# Patient Record
Sex: Male | Born: 1965 | Race: White | Hispanic: No | Marital: Married | Smoking: Never smoker
Health system: Southern US, Community
[De-identification: ages and names within clinical notes are randomized; demographics above are authoritative.]

## PROBLEM LIST (undated history)

## (undated) DIAGNOSIS — G1221 Amyotrophic lateral sclerosis: Secondary | ICD-10-CM

## (undated) HISTORY — PX: GASTROSTOMY TUBE PLACEMENT: SHX655

## (undated) HISTORY — PX: TRACHEOSTOMY: SUR1362

---

## 2019-09-10 ENCOUNTER — Emergency Department (HOSPITAL_COMMUNITY): Payer: BC Managed Care – PPO

## 2019-09-10 ENCOUNTER — Encounter (HOSPITAL_COMMUNITY): Payer: Self-pay | Admitting: Emergency Medicine

## 2019-09-10 ENCOUNTER — Emergency Department (HOSPITAL_COMMUNITY)
Admission: EM | Admit: 2019-09-10 | Discharge: 2019-09-10 | Disposition: A | Discharge status: Home / Self Care | Payer: BC Managed Care – PPO | Attending: Emergency Medicine | Admitting: Emergency Medicine

## 2019-09-10 DIAGNOSIS — I959 Hypotension, unspecified: Secondary | ICD-10-CM | POA: Diagnosis not present

## 2019-09-10 DIAGNOSIS — R0602 Shortness of breath: Secondary | ICD-10-CM | POA: Diagnosis present

## 2019-09-10 HISTORY — DX: Amyotrophic lateral sclerosis: G12.21

## 2019-09-10 LAB — COMPREHENSIVE METABOLIC PANEL
ALT: 15 U/L (ref 0–44)
AST: 13 U/L — ABNORMAL LOW (ref 15–41)
Albumin: 1.8 g/dL — ABNORMAL LOW (ref 3.5–5.0)
Alkaline Phosphatase: 59 U/L (ref 38–126)
Anion gap: 7 (ref 5–15)
BUN: 31 mg/dL — ABNORMAL HIGH (ref 6–20)
CO2: 31 mmol/L (ref 22–32)
Calcium: 8.3 mg/dL — ABNORMAL LOW (ref 8.9–10.3)
Chloride: 112 mmol/L — ABNORMAL HIGH (ref 98–111)
Creatinine, Ser: <0.3 mg/dL — ABNORMAL LOW (ref 0.61–1.24)
Glucose, Bld: 112 mg/dL — ABNORMAL HIGH (ref 70–99)
Potassium: 3.8 mmol/L (ref 3.5–5.1)
Sodium: 150 mmol/L — ABNORMAL HIGH (ref 135–145)
Total Bilirubin: 0.4 mg/dL (ref 0.3–1.2)
Total Protein: 6.6 g/dL (ref 6.5–8.1)

## 2019-09-10 LAB — CBC WITH DIFFERENTIAL/PLATELET
Abs Immature Granulocytes: 0.47 10*3/uL — ABNORMAL HIGH (ref 0.00–0.07)
Basophils Absolute: 0.1 10*3/uL (ref 0.0–0.1)
Basophils Relative: 1 %
Eosinophils Absolute: 1.1 10*3/uL — ABNORMAL HIGH (ref 0.0–0.5)
Eosinophils Relative: 9 %
HCT: 29.5 % — ABNORMAL LOW (ref 39.0–52.0)
Hemoglobin: 8.4 g/dL — ABNORMAL LOW (ref 13.0–17.0)
Lymphocytes Relative: 23 %
Lymphs Abs: 2.8 10*3/uL (ref 0.7–4.0)
MCH: 27.5 pg (ref 26.0–34.0)
MCHC: 28.5 g/dL — ABNORMAL LOW (ref 30.0–36.0)
MCV: 96.7 fL (ref 80.0–100.0)
Monocytes Absolute: 0.6 10*3/uL (ref 0.1–1.0)
Monocytes Relative: 5 %
Neutro Abs: 7.3 10*3/uL (ref 1.7–7.7)
Neutrophils Relative %: 58 %
Platelets: 464 10*3/uL — ABNORMAL HIGH (ref 150–400)
RBC: 3.05 MIL/uL — ABNORMAL LOW (ref 4.22–5.81)
RDW: 15.6 % — ABNORMAL HIGH (ref 11.5–15.5)
WBC: 12.3 10*3/uL — ABNORMAL HIGH (ref 4.0–10.5)
nRBC: 0 % (ref 0.0–0.2)

## 2019-09-10 LAB — APTT: aPTT: 29 seconds (ref 24–36)

## 2019-09-10 LAB — URINALYSIS, ROUTINE W REFLEX MICROSCOPIC
Bilirubin Urine: NEGATIVE
Glucose, UA: NEGATIVE mg/dL
Ketones, ur: NEGATIVE mg/dL
Nitrite: NEGATIVE
Protein, ur: 100 mg/dL — AB
RBC / HPF: >50 RBC/hpf — ABNORMAL HIGH (ref 0–5)
Specific Gravity, Urine: 1.025 (ref 1.005–1.030)
pH: 7 (ref 5.0–8.0)

## 2019-09-10 LAB — PROTIME-INR
INR: 1.1 (ref 0.8–1.2)
Prothrombin Time: 13.8 seconds (ref 11.4–15.2)

## 2019-09-10 LAB — LACTIC ACID, PLASMA: Lactic Acid, Venous: 1 mmol/L (ref 0.5–1.9)

## 2019-09-10 MED ORDER — SODIUM CHLORIDE 0.9 % IV BOLUS
1000.0000 mL | Freq: Once | INTRAVENOUS | Status: AC
  Administered 2019-09-10: 1000 mL via INTRAVENOUS

## 2019-09-10 NOTE — ED Notes (Signed)
Irving Burton (Wife/POA#[phone]) called for an update.  Thank you

## 2019-09-10 NOTE — ED Provider Notes (Signed)
[facility] EMERGENCY DEPARTMENT Provider Note   CSN: [id number] Arrival date & time: 09/10/19  8127     History Chief Complaint  Patient presents with  . Shortness of Breath    Stuart Woods is a 54 y.o. male.  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  Sent here for hypotension and change in mental status.  Per EMS was noted just this morning.  Currently being treated for pneumonia with meropenem and vancomycin.  Patient is at his baseline per EMS.  Nonverbal.  Level 5 caveat.  The history is provided by the patient.  Shortness of Breath Severity:  Moderate Onset quality:  Gradual Duration:  2 days Timing:  Constant Progression:  Worsening Chronicity:  New Relieved by:  Nothing Worsened by:  Nothing Ineffective treatments:  None tried Associated symptoms: no abdominal pain, no chest pain, no fever, no headaches, no rash and no vomiting        History reviewed. No pertinent past medical history.  There are no problems to display for this patient.   History reviewed. No pertinent surgical history.     No family history on file.  Social History   Tobacco Use  . Smoking status: Not on file  Substance Use Topics  . Alcohol use: Not on file  . Drug use: Not on file    Home Medications Prior to Admission medications   Not on File    Allergies    Patient has no allergy information on record.  Review of Systems   Review of Systems  Unable to perform ROS: Patient nonverbal  Constitutional: Positive for activity change. Negative for chills and fever.  HENT: Negative for congestion and facial swelling.   Eyes: Negative for discharge and visual disturbance.  Respiratory: Negative for shortness of breath.   Cardiovascular: Negative for chest pain and palpitations.  Gastrointestinal: Negative for abdominal pain, diarrhea and vomiting.  Musculoskeletal: Negative for arthralgias  and myalgias.  Skin: Negative for color change and rash.  Neurological: Negative for tremors, syncope and headaches.  Psychiatric/Behavioral: Negative for confusion and dysphoric mood.    Physical Exam Updated Vital Signs BP 91/69   Pulse 80   Temp (!) 97.4 F (36.3 C) (Axillary)   Resp 20   SpO2 100%   Physical Exam Vitals and nursing note reviewed.  Constitutional:      Appearance: He is well-developed.     Comments: Chronically ill-appearing  HENT:     Head: Normocephalic and atraumatic.  Eyes:     Pupils: Pupils are equal, round, and reactive to light.  Neck:     Vascular: No JVD.  Cardiovascular:     Rate and Rhythm: Normal rate and regular rhythm.     Heart sounds: No murmur. No friction rub. No gallop.   Pulmonary:     Effort: No respiratory distress.     Breath sounds: No wheezing.  Abdominal:     General: There is no distension.     Tenderness: There is no guarding or rebound.  Musculoskeletal:        General: Normal range of motion.     Cervical back: Normal range of motion and neck supple.     Comments: Diffuse muscle wasting  Skin:    Coloration: Skin is not pale.     Findings: No rash.  Neurological:     Mental Status: He is alert and oriented to person, place,  and time.  Psychiatric:        Behavior: Behavior normal.     ED Results / Procedures / Treatments   Labs (all labs ordered are listed, but only abnormal results are displayed) Labs Reviewed  COMPREHENSIVE METABOLIC PANEL - Abnormal; Notable for the following components:      Result Value   Sodium 150 (*)    Chloride 112 (*)    Glucose, Bld 112 (*)    BUN 31 (*)    Creatinine, Ser <0.30 (*)    Calcium 8.3 (*)    Albumin 1.8 (*)    AST 13 (*)    All other components within normal limits  CBC WITH DIFFERENTIAL/PLATELET - Abnormal; Notable for the following components:   WBC 12.3 (*)    RBC 3.05 (*)    Hemoglobin 8.4 (*)    HCT 29.5 (*)    MCHC 28.5 (*)    RDW 15.6 (*)     Platelets 464 (*)    Eosinophils Absolute 1.1 (*)    Abs Immature Granulocytes 0.47 (*)    All other components within normal limits  CULTURE, BLOOD (ROUTINE X 2)  CULTURE, BLOOD (ROUTINE X 2)  URINE CULTURE  LACTIC ACID, PLASMA  APTT  PROTIME-INR  LACTIC ACID, PLASMA  URINALYSIS, ROUTINE W REFLEX MICROSCOPIC    EKG EKG Interpretation  Date/Time:  Wednesday September 10 2019 10:00:28 EST Ventricular Rate:  76 PR Interval:    QRS Duration: 88 QT Interval:  413 QTC Calculation: 465 R Axis:   68 Text Interpretation: Sinus rhythm Borderline short PR interval ST elev, probable normal early repol pattern No old tracing to compare Confirmed by Melene Plan [phone]) on 09/10/2019 10:30:51 AM   Radiology DG Chest Port 1 View  Result Date: 09/10/2019 CLINICAL DATA:  Hypotension EXAM: PORTABLE CHEST 1 VIEW COMPARISON:  Portable exam 0951 hours without priors for comparison FINDINGS: Tip of RIGHT arm PICC line projects over SVC near cavoatrial junction. Tracheostomy tube projects over tracheal air column. Normal heart size, mediastinal contours, and pulmonary vascularity. Atelectasis versus consolidation LEFT lower lobe. Remaining lungs clear. No pleural effusion or pneumothorax. Sclerosis at LEFT humeral head could be degenerative or due to avascular necrosis. IMPRESSION: LEFT lower lobe atelectasis versus consolidation. Sclerosis at LEFT humeral head question degenerative or due to avascular necrosis. Electronically Signed   By: Ulyses Southward M.D.   On: 09/10/2019 10:18    Procedures Procedures (including critical care time)  Medications Ordered in ED Medications  sodium chloride 0.9 % bolus 1,000 mL (1,000 mLs Intravenous New Bag/Given 09/10/19 1148)    ED Course  I have reviewed the triage vital signs and the nursing notes.  Pertinent labs & imaging results that were available during my care of the patient were reviewed by me and considered in my medical decision making (see chart for  details).    MDM Rules/Calculators/A&P                      54 yo M with a chief complaints of change in mental status and hypotension.  Unfortunately the patient has ALS and is chronically vent dependent.  He is currently getting a course of antibiotics for HCAP.  On Vanco and meropenem.  Patient reportedly back to baseline after a small bolus of IV fluids.  Will give another bolus of IV fluids.  Chest x-ray lab work lactate.  Lactic acid is normal.  Patient is hyponatremic.  Continues to have stable blood  pressures.  No urine sample as of yet though I do not feel that it would change necessarily his disposition.  Patient is currently able to get antibiotics at his facility has not had any hypotension here and his lactate is normal.  He is on broad-spectrum therapy that I would likely not deviate for without a culture.  We will have him go back to his facility.  They can check a urine if they feel that that is the source.  3:30 PM:  I have discussed the diagnosis/risks/treatment options with the patient and believe the pt to be eligible for discharge home to follow-up with PCP. We also discussed returning to the ED immediately if new or worsening sx occur. We discussed the sx which are most concerning (e.g., sudden worsening pain, fever, inability to tolerate by mouth) that necessitate immediate return. Medications administered to the patient during their visit and any new prescriptions provided to the patient are listed below.  Medications given during this visit Medications  sodium chloride 0.9 % bolus 1,000 mL (1,000 mLs Intravenous New Bag/Given 09/10/19 1148)     The patient appears reasonably screen and/or stabilized for discharge and I doubt any other medical condition or other [facility] requiring further screening, evaluation, or treatment in the ED at this time prior to discharge.   Final Clinical Impression(s) / ED Diagnoses Final diagnoses:  Hypotension, unspecified hypotension type     Rx / DC Orders ED Discharge Orders    None       Deno Etienne, DO 09/10/19 1530

## 2019-09-10 NOTE — Discharge Instructions (Addendum)
Return for worsening symptoms.    

## 2019-09-10 NOTE — ED Provider Notes (Signed)
Pending basic labs and UA, if within patient's baseline, anticipate D/C. Physical Exam  BP 91/69   Pulse 80   Temp (!) 97.4 F (36.3 C) (Axillary)   Resp 20   SpO2 100%   Physical Exam  ED Course/Procedures     Procedures  MDM  Dr. Adela Lank determined appropriate for d/c. Will continue to observe while in ED awaiting transport.       Arby Barrette, MD 09/10/19 [phone]

## 2019-09-10 NOTE — ED Triage Notes (Addendum)
Pt here from [facility] 82/60 bp, decreased loc hr 79, O2 98 (pt is vented trach).  Given 500 ns and bp increased to 98 sytolic and loc increased after fluids.  Per [facility], pt is back to his baseline.   Pt has been at [facility] since October for ALS.

## 2019-09-11 LAB — URINE CULTURE: Culture: NO GROWTH

## 2019-09-15 LAB — CULTURE, BLOOD (ROUTINE X 2)
Culture: NO GROWTH
Culture: NO GROWTH
Special Requests: ADEQUATE

## 2019-09-21 ENCOUNTER — Encounter (HOSPITAL_COMMUNITY): Payer: Self-pay | Admitting: Emergency Medicine

## 2019-09-21 ENCOUNTER — Emergency Department (HOSPITAL_COMMUNITY): Payer: BC Managed Care – PPO

## 2019-09-21 ENCOUNTER — Inpatient Hospital Stay (HOSPITAL_COMMUNITY)
Admission: EM | Admit: 2019-09-21 | Discharge: 2019-09-23 | DRG: 640 | Disposition: A | Discharge status: Other Acute Inpatient Hospital | Payer: BC Managed Care – PPO | Source: Other Acute Inpatient Hospital | Attending: Pulmonary Disease | Admitting: Pulmonary Disease

## 2019-09-21 ENCOUNTER — Other Ambulatory Visit: Payer: Self-pay

## 2019-09-21 DIAGNOSIS — L89152 Pressure ulcer of sacral region, stage 2: Secondary | ICD-10-CM | POA: Diagnosis present

## 2019-09-21 DIAGNOSIS — G1221 Amyotrophic lateral sclerosis: Secondary | ICD-10-CM | POA: Diagnosis not present

## 2019-09-21 DIAGNOSIS — D649 Anemia, unspecified: Secondary | ICD-10-CM | POA: Diagnosis present

## 2019-09-21 DIAGNOSIS — E874 Mixed disorder of acid-base balance: Secondary | ICD-10-CM | POA: Diagnosis not present

## 2019-09-21 DIAGNOSIS — M1990 Unspecified osteoarthritis, unspecified site: Secondary | ICD-10-CM | POA: Diagnosis not present

## 2019-09-21 DIAGNOSIS — L89322 Pressure ulcer of left buttock, stage 2: Secondary | ICD-10-CM | POA: Diagnosis present

## 2019-09-21 DIAGNOSIS — K219 Gastro-esophageal reflux disease without esophagitis: Secondary | ICD-10-CM | POA: Diagnosis not present

## 2019-09-21 DIAGNOSIS — Z820 Family history of epilepsy and other diseases of the nervous system: Secondary | ICD-10-CM

## 2019-09-21 DIAGNOSIS — E876 Hypokalemia: Secondary | ICD-10-CM | POA: Diagnosis present

## 2019-09-21 DIAGNOSIS — F329 Major depressive disorder, single episode, unspecified: Secondary | ICD-10-CM | POA: Diagnosis present

## 2019-09-21 DIAGNOSIS — N390 Urinary tract infection, site not specified: Secondary | ICD-10-CM

## 2019-09-21 DIAGNOSIS — Z9911 Dependence on respirator [ventilator] status: Secondary | ICD-10-CM | POA: Diagnosis not present

## 2019-09-21 DIAGNOSIS — I959 Hypotension, unspecified: Secondary | ICD-10-CM | POA: Diagnosis not present

## 2019-09-21 DIAGNOSIS — Z20822 Contact with and (suspected) exposure to covid-19: Secondary | ICD-10-CM | POA: Diagnosis present

## 2019-09-21 DIAGNOSIS — G9340 Encephalopathy, unspecified: Secondary | ICD-10-CM | POA: Diagnosis not present

## 2019-09-21 DIAGNOSIS — J9610 Chronic respiratory failure, unspecified whether with hypoxia or hypercapnia: Secondary | ICD-10-CM | POA: Diagnosis present

## 2019-09-21 DIAGNOSIS — R4182 Altered mental status, unspecified: Secondary | ICD-10-CM

## 2019-09-21 DIAGNOSIS — Z930 Tracheostomy status: Secondary | ICD-10-CM

## 2019-09-21 DIAGNOSIS — L89891 Pressure ulcer of other site, stage 1: Secondary | ICD-10-CM | POA: Diagnosis not present

## 2019-09-21 DIAGNOSIS — L89312 Pressure ulcer of right buttock, stage 2: Secondary | ICD-10-CM | POA: Diagnosis present

## 2019-09-21 DIAGNOSIS — L89111 Pressure ulcer of right upper back, stage 1: Secondary | ICD-10-CM | POA: Diagnosis present

## 2019-09-21 DIAGNOSIS — G9341 Metabolic encephalopathy: Secondary | ICD-10-CM | POA: Diagnosis not present

## 2019-09-21 DIAGNOSIS — J189 Pneumonia, unspecified organism: Secondary | ICD-10-CM

## 2019-09-21 DIAGNOSIS — Z931 Gastrostomy status: Secondary | ICD-10-CM | POA: Diagnosis not present

## 2019-09-21 DIAGNOSIS — Z79899 Other long term (current) drug therapy: Secondary | ICD-10-CM

## 2019-09-21 DIAGNOSIS — Z8701 Personal history of pneumonia (recurrent): Secondary | ICD-10-CM | POA: Diagnosis not present

## 2019-09-21 DIAGNOSIS — L8990 Pressure ulcer of unspecified site, unspecified stage: Secondary | ICD-10-CM | POA: Insufficient documentation

## 2019-09-21 DIAGNOSIS — J9601 Acute respiratory failure with hypoxia: Secondary | ICD-10-CM

## 2019-09-21 LAB — CBC WITH DIFFERENTIAL/PLATELET
Abs Immature Granulocytes: 0.08 10*3/uL — ABNORMAL HIGH (ref 0.00–0.07)
Basophils Absolute: 0 10*3/uL (ref 0.0–0.1)
Basophils Relative: 0 %
Eosinophils Absolute: 0.1 10*3/uL (ref 0.0–0.5)
Eosinophils Relative: 1 %
HCT: 31.8 % — ABNORMAL LOW (ref 39.0–52.0)
Hemoglobin: 9.3 g/dL — ABNORMAL LOW (ref 13.0–17.0)
Immature Granulocytes: 1 %
Lymphocytes Relative: 12 %
Lymphs Abs: 1.5 10*3/uL (ref 0.7–4.0)
MCH: 27.6 pg (ref 26.0–34.0)
MCHC: 29.2 g/dL — ABNORMAL LOW (ref 30.0–36.0)
MCV: 94.4 fL (ref 80.0–100.0)
Monocytes Absolute: 0.4 10*3/uL (ref 0.1–1.0)
Monocytes Relative: 3 %
Neutro Abs: 10.5 10*3/uL — ABNORMAL HIGH (ref 1.7–7.7)
Neutrophils Relative %: 83 %
Platelets: 735 10*3/uL — ABNORMAL HIGH (ref 150–400)
RBC: 3.37 MIL/uL — ABNORMAL LOW (ref 4.22–5.81)
RDW: 16.1 % — ABNORMAL HIGH (ref 11.5–15.5)
WBC: 12.7 10*3/uL — ABNORMAL HIGH (ref 4.0–10.5)
nRBC: 0 % (ref 0.0–0.2)

## 2019-09-21 LAB — CBG MONITORING, ED: Glucose-Capillary: 97 mg/dL (ref 70–99)

## 2019-09-21 NOTE — ED Triage Notes (Signed)
Pt from [facility] by [facility], hx of ALS, trach, and vent dept. Staff called for transport for pt having increased AMS.  G tube present. PICC line to R arm and butterfly IV to R hand

## 2019-09-21 NOTE — ED Provider Notes (Signed)
[facility] EMERGENCY DEPARTMENT Provider Note   CSN: [id number] Arrival date & time: 09/21/19  2217     History Chief Complaint  Patient presents with  . Altered Mental Status    Stuart Woods is a 54 y.o. male.  The history is provided by the EMS personnel and medical records. The history is limited by the condition of the patient.  Altered Mental Status Presenting symptoms: partial responsiveness   Severity:  Moderate Most recent episode:  Yesterday Episode history:  Single Duration:  1 day Timing:  Constant Progression:  Unchanged Context: not dementia   Associated symptoms: no abdominal pain, no fever and no weakness        Past Medical History:  Diagnosis Date  . ALS (amyotrophic lateral sclerosis) ([place])     There are no problems to display for this patient.   Past Surgical History:  Procedure Laterality Date  . GASTROSTOMY TUBE PLACEMENT    . TRACHEOSTOMY         History reviewed. No pertinent family history.  Social History   Tobacco Use  . Smoking status: Not on file  Substance Use Topics  . Alcohol use: Not on file  . Drug use: Not on file    Home Medications Prior to Admission medications   Not on File    Allergies    Patient has no known allergies.  Review of Systems   Review of Systems  Unable to perform ROS: Mental status change  Constitutional: Negative for fever.  Gastrointestinal: Negative for abdominal pain.  Neurological: Negative for weakness.    Physical Exam Updated Vital Signs BP 92/71   Pulse 95   Temp 97.9 F (36.6 C) (Temporal)   Resp 20   SpO2 100%   Physical Exam Vitals and nursing note reviewed.  Constitutional:      General: He is not in acute distress.    Appearance: Normal appearance.  HENT:     Head: Normocephalic and atraumatic.     Nose: Nose normal.  Eyes:     Conjunctiva/sclera: Conjunctivae normal.     Pupils: Pupils are equal, round, and reactive to light.   Cardiovascular:     Rate and Rhythm: Normal rate and regular rhythm.     Pulses: Normal pulses.     Heart sounds: Normal heart sounds.  Pulmonary:     Breath sounds: No rales.  Abdominal:     General: Abdomen is flat. Bowel sounds are normal.     Tenderness: There is no abdominal tenderness. There is no guarding.  Musculoskeletal:     Cervical back: Normal range of motion and neck supple.     Right lower leg: No edema.     Left lower leg: No edema.  Skin:    General: Skin is warm and dry.     Capillary Refill: Capillary refill takes less than 2 seconds.  Neurological:     Deep Tendon Reflexes: Reflexes normal.  Psychiatric:     Comments: unable     ED Results / Procedures / Treatments   Labs (all labs ordered are listed, but only abnormal results are displayed) Results for orders placed or performed during the hospital encounter of 09/21/19  CBC with Differential  Result Value Ref Range   WBC 12.7 (H) 4.0 - 10.5 K/uL   RBC 3.37 (L) 4.22 - 5.81 MIL/uL   Hemoglobin 9.3 (L) 13.0 - 17.0 g/dL   HCT 31.8 (L) 39.0 - 52.0 %   MCV 94.4 80.0 -  100.0 fL   MCH 27.6 26.0 - 34.0 pg   MCHC 29.2 (L) 30.0 - 36.0 g/dL   RDW 50.9 (H) 32.6 - 71.2 %   Platelets 735 (H) 150 - 400 K/uL   nRBC 0.0 0.0 - 0.2 %   Neutrophils Relative % 83 %   Neutro Abs 10.5 (H) 1.7 - 7.7 K/uL   Lymphocytes Relative 12 %   Lymphs Abs 1.5 0.7 - 4.0 K/uL   Monocytes Relative 3 %   Monocytes Absolute 0.4 0.1 - 1.0 K/uL   Eosinophils Relative 1 %   Eosinophils Absolute 0.1 0.0 - 0.5 K/uL   Basophils Relative 0 %   Basophils Absolute 0.0 0.0 - 0.1 K/uL   Immature Granulocytes 1 %   Abs Immature Granulocytes 0.08 (H) 0.00 - 0.07 K/uL  Comprehensive metabolic panel  Result Value Ref Range   Sodium 138 135 - 145 mmol/L   Potassium 4.3 3.5 - 5.1 mmol/L   Chloride 95 (L) 98 - 111 mmol/L   CO2 33 (H) 22 - 32 mmol/L   Glucose, Bld 102 (H) 70 - 99 mg/dL   BUN 19 6 - 20 mg/dL   Creatinine, Ser <4.58 (L) 0.61 -  1.24 mg/dL   Calcium 9.4 8.9 - 09.9 mg/dL   Total Protein 7.6 6.5 - 8.1 g/dL   Albumin 2.4 (L) 3.5 - 5.0 g/dL   AST 18 15 - 41 U/L   ALT 25 0 - 44 U/L   Alkaline Phosphatase 81 38 - 126 U/L   Total Bilirubin 0.3 0.3 - 1.2 mg/dL   GFR calc non Af Amer NOT CALCULATED >60 mL/min   GFR calc Af Amer NOT CALCULATED >60 mL/min   Anion gap 10 5 - 15  Urinalysis, Routine w reflex microscopic  Result Value Ref Range   Color, Urine YELLOW YELLOW   APPearance TURBID (A) CLEAR   Specific Gravity, Urine 1.028 1.005 - 1.030   pH 5.0 5.0 - 8.0   Glucose, UA NEGATIVE NEGATIVE mg/dL   Hgb urine dipstick NEGATIVE NEGATIVE   Bilirubin Urine NEGATIVE NEGATIVE   Ketones, ur NEGATIVE NEGATIVE mg/dL   Protein, ur 833 (A) NEGATIVE mg/dL   Nitrite NEGATIVE NEGATIVE   Leukocytes,Ua NEGATIVE NEGATIVE   RBC / HPF 6-10 0 - 5 RBC/hpf   WBC, UA 6-10 0 - 5 WBC/hpf   Bacteria, UA MANY (A) NONE SEEN   Squamous Epithelial / LPF 0-5 0 - 5   Mucus PRESENT    Hyaline Casts, UA PRESENT    Uric Acid Crys, UA PRESENT   Ammonia  Result Value Ref Range   Ammonia 44 (H) 9 - 35 umol/L  Acetaminophen level  Result Value Ref Range   Acetaminophen (Tylenol), Serum <10 (L) 10 - 30 ug/mL  Salicylate level  Result Value Ref Range   Salicylate Lvl <7.0 (L) 7.0 - 30.0 mg/dL  Rapid urine drug screen (hospital performed)  Result Value Ref Range   Opiates NONE DETECTED NONE DETECTED   Cocaine NONE DETECTED NONE DETECTED   Benzodiazepines NONE DETECTED NONE DETECTED   Amphetamines NONE DETECTED NONE DETECTED   Tetrahydrocannabinol NONE DETECTED NONE DETECTED   Barbiturates NONE DETECTED NONE DETECTED  CBG monitoring, ED  Result Value Ref Range   Glucose-Capillary 97 70 - 99 mg/dL  Troponin I (High Sensitivity)  Result Value Ref Range   Troponin I (High Sensitivity) 4 <18 ng/L   CT Head Wo Contrast  Result Date: 09/22/2019 CLINICAL DATA:  Encephalopathy.  Worsening  altered mental status. EXAM: CT HEAD WITHOUT CONTRAST  TECHNIQUE: Contiguous axial images were obtained from the base of the skull through the vertex without intravenous contrast. COMPARISON:  None. FINDINGS: Brain: No evidence of acute infarction, hemorrhage, hydrocephalus, extra-axial collection or mass lesion/mass effect. Atrophy is noted, greater than expected for the patient's age. There are mild chronic microvascular ischemic changes. Vascular: No hyperdense vessel or unexpected calcification. Skull: Normal. Negative for fracture or focal lesion. Sinuses/Orbits: No acute finding. Other: None. IMPRESSION: 1. No acute intracranial abnormality. 2. Cerebral atrophy, greater than expected for the patient's age. Electronically Signed   By: Katherine Mantle M.D.   On: 09/22/2019 00:39   DG Chest Port 1 View  Result Date: 09/21/2019 CLINICAL DATA:  Altered mental status. EXAM: PORTABLE CHEST 1 VIEW COMPARISON:  September 10, 2019 FINDINGS: The right-sided PICC line is well position. The tracheostomy tube terminates above the carina. There is a persistent but improved left lower lobe airspace opacity. There is no pneumothorax. No large pleural effusion. The lungs appear slightly hyperexpanded. There is no acute osseous abnormality. IMPRESSION: 1. Lines and tubes as above. 2. Persistent but improving left lower lobe consolidation. Electronically Signed   By: Katherine Mantle M.D.   On: 09/21/2019 22:52   DG Chest Port 1 View  Result Date: 09/10/2019 CLINICAL DATA:  Hypotension EXAM: PORTABLE CHEST 1 VIEW COMPARISON:  Portable exam 0951 hours without priors for comparison FINDINGS: Tip of RIGHT arm PICC line projects over SVC near cavoatrial junction. Tracheostomy tube projects over tracheal air column. Normal heart size, mediastinal contours, and pulmonary vascularity. Atelectasis versus consolidation LEFT lower lobe. Remaining lungs clear. No pleural effusion or pneumothorax. Sclerosis at LEFT humeral head could be degenerative or due to avascular necrosis.  IMPRESSION: LEFT lower lobe atelectasis versus consolidation. Sclerosis at LEFT humeral head question degenerative or due to avascular necrosis. Electronically Signed   By: Ulyses Southward M.D.   On: 09/10/2019 10:18    EKG EKG Interpretation  Date/Time:  Sunday September 21 2019 23:18:08 EST Ventricular Rate:  99 PR Interval:    QRS Duration: 71 QT Interval:  332 QTC Calculation: 426 R Axis:   70 Text Interpretation: Sinus rhythm Consider right atrial enlargement Confirmed by Rossy Virag ([id number]) on 09/21/2019 11:22:25 PM   Radiology DG Chest Port 1 View  Result Date: 09/21/2019 CLINICAL DATA:  Altered mental status. EXAM: PORTABLE CHEST 1 VIEW COMPARISON:  September 10, 2019 FINDINGS: The right-sided PICC line is well position. The tracheostomy tube terminates above the carina. There is a persistent but improved left lower lobe airspace opacity. There is no pneumothorax. No large pleural effusion. The lungs appear slightly hyperexpanded. There is no acute osseous abnormality. IMPRESSION: 1. Lines and tubes as above. 2. Persistent but improving left lower lobe consolidation. Electronically Signed   By: Katherine Mantle M.D.   On: 09/21/2019 22:52    Procedures Procedures (including critical care time)  Medications Ordered in ED Medications  vancomycin (VANCOCIN) IVPB 1000 mg/200 mL premix (1,000 mg Intravenous New Bag/Given 09/22/19 0207)  enoxaparin (LOVENOX) injection 40 mg (has no administration in time range)  famotidine (PEPCID) 40 MG/5ML suspension 20 mg (has no administration in time range)  acetaminophen (TYLENOL) tablet 650 mg (has no administration in time range)  gabapentin (NEURONTIN) 250 MG/5ML solution 300 mg (has no administration in time range)  lactulose (CHRONULAC) 10 GM/15ML solution 10 g (has no administration in time range)  folic acid (FOLVITE) tablet 1 mg (has no administration in time range)  midodrine (PROAMATINE) tablet 5 mg (has no administration in time range)   sertraline (ZOLOFT) tablet 100 mg (has no administration in time range)  traZODone (DESYREL) tablet 50 mg (has no administration in time range)  baclofen (LIORESAL) tablet 5 mg (has no administration in time range)  ipratropium-albuterol (DUONEB) 0.5-2.5 (3) MG/3ML nebulizer solution 3 mL (has no administration in time range)  riluzole (RILUTEK) tablet 50 mg (has no administration in time range)  lactated ringers infusion (has no administration in time range)  sodium chloride 0.9 % bolus 500 mL (500 mLs Intravenous New Bag/Given 09/22/19 0207)  piperacillin-tazobactam (ZOSYN) IVPB 3.375 g (3.375 g Intravenous New Bag/Given 09/22/19 0209)    Final Clinical Impression(s) / ED Diagnoses Admit to ICU for AMS   Minervia Osso, MD 09/22/19 1941

## 2019-09-21 NOTE — ED Provider Notes (Signed)
MSE was initiated and I personally evaluated the patient and placed orders (if any) at  10:34 PM on September 21, 2019.  The patient appears stable so that the remainder of the MSE may be completed by another provider.  54 year old male with ALS resident at [facility].  Trach and vent dependent, PEG, condom cath.  Staff called to transport because the patient had a new change in mental status.  Unclear baseline.  Patient here not following commands.  Lungs clear on ventilator.  Abdomen soft.  Initial screening labs chest x-ray EKG and head CT ordered.  Will sign out to oncoming provider.   Terrilee Files, MD 09/22/19 1058

## 2019-09-21 NOTE — ED Notes (Signed)
RT unable to help transport pt to CT. RT to call back when available to send another tech.

## 2019-09-22 ENCOUNTER — Emergency Department (HOSPITAL_COMMUNITY): Payer: BC Managed Care – PPO

## 2019-09-22 ENCOUNTER — Encounter (HOSPITAL_COMMUNITY): Payer: Self-pay | Admitting: Emergency Medicine

## 2019-09-22 DIAGNOSIS — Z820 Family history of epilepsy and other diseases of the nervous system: Secondary | ICD-10-CM | POA: Diagnosis not present

## 2019-09-22 DIAGNOSIS — G9340 Encephalopathy, unspecified: Secondary | ICD-10-CM | POA: Diagnosis present

## 2019-09-22 DIAGNOSIS — E874 Mixed disorder of acid-base balance: Secondary | ICD-10-CM | POA: Diagnosis present

## 2019-09-22 DIAGNOSIS — Z930 Tracheostomy status: Secondary | ICD-10-CM | POA: Diagnosis not present

## 2019-09-22 DIAGNOSIS — L89152 Pressure ulcer of sacral region, stage 2: Secondary | ICD-10-CM | POA: Diagnosis present

## 2019-09-22 DIAGNOSIS — G1221 Amyotrophic lateral sclerosis: Secondary | ICD-10-CM | POA: Diagnosis present

## 2019-09-22 DIAGNOSIS — Z20822 Contact with and (suspected) exposure to covid-19: Secondary | ICD-10-CM | POA: Diagnosis present

## 2019-09-22 DIAGNOSIS — L89891 Pressure ulcer of other site, stage 1: Secondary | ICD-10-CM | POA: Diagnosis present

## 2019-09-22 DIAGNOSIS — D649 Anemia, unspecified: Secondary | ICD-10-CM | POA: Diagnosis present

## 2019-09-22 DIAGNOSIS — M1990 Unspecified osteoarthritis, unspecified site: Secondary | ICD-10-CM | POA: Diagnosis present

## 2019-09-22 DIAGNOSIS — K219 Gastro-esophageal reflux disease without esophagitis: Secondary | ICD-10-CM | POA: Diagnosis present

## 2019-09-22 DIAGNOSIS — Z931 Gastrostomy status: Secondary | ICD-10-CM | POA: Diagnosis not present

## 2019-09-22 DIAGNOSIS — F329 Major depressive disorder, single episode, unspecified: Secondary | ICD-10-CM | POA: Diagnosis present

## 2019-09-22 DIAGNOSIS — Z8701 Personal history of pneumonia (recurrent): Secondary | ICD-10-CM | POA: Diagnosis not present

## 2019-09-22 DIAGNOSIS — E876 Hypokalemia: Secondary | ICD-10-CM | POA: Diagnosis present

## 2019-09-22 DIAGNOSIS — L89322 Pressure ulcer of left buttock, stage 2: Secondary | ICD-10-CM | POA: Diagnosis present

## 2019-09-22 DIAGNOSIS — Z9911 Dependence on respirator [ventilator] status: Secondary | ICD-10-CM | POA: Diagnosis not present

## 2019-09-22 DIAGNOSIS — J9610 Chronic respiratory failure, unspecified whether with hypoxia or hypercapnia: Secondary | ICD-10-CM | POA: Diagnosis present

## 2019-09-22 DIAGNOSIS — Z79899 Other long term (current) drug therapy: Secondary | ICD-10-CM | POA: Diagnosis not present

## 2019-09-22 DIAGNOSIS — L89111 Pressure ulcer of right upper back, stage 1: Secondary | ICD-10-CM | POA: Diagnosis present

## 2019-09-22 DIAGNOSIS — I959 Hypotension, unspecified: Secondary | ICD-10-CM | POA: Diagnosis present

## 2019-09-22 DIAGNOSIS — L89312 Pressure ulcer of right buttock, stage 2: Secondary | ICD-10-CM | POA: Diagnosis present

## 2019-09-22 DIAGNOSIS — G9341 Metabolic encephalopathy: Secondary | ICD-10-CM | POA: Diagnosis present

## 2019-09-22 LAB — POCT I-STAT 7, (LYTES, BLD GAS, ICA,H+H)
Acid-Base Excess: 13 mmol/L — ABNORMAL HIGH (ref 0.0–2.0)
Acid-Base Excess: 13 mmol/L — ABNORMAL HIGH (ref 0.0–2.0)
Bicarbonate: 34.8 mmol/L — ABNORMAL HIGH (ref 20.0–28.0)
Bicarbonate: 35.8 mmol/L — ABNORMAL HIGH (ref 20.0–28.0)
Calcium, Ion: 1.24 mmol/L (ref 1.15–1.40)
Calcium, Ion: 1.23 mmol/L (ref 1.15–1.40)
HCT: 32 % — ABNORMAL LOW (ref 39.0–52.0)
HCT: 25 % — ABNORMAL LOW (ref 39.0–52.0)
Hemoglobin: 10.9 g/dL — ABNORMAL LOW (ref 13.0–17.0)
Hemoglobin: 8.5 g/dL — ABNORMAL LOW (ref 13.0–17.0)
O2 Saturation: 98 %
O2 Saturation: 99 %
Patient temperature: 97.7
Potassium: 3.5 mmol/L (ref 3.5–5.1)
Potassium: 3.5 mmol/L (ref 3.5–5.1)
Sodium: 140 mmol/L (ref 135–145)
Sodium: 139 mmol/L (ref 135–145)
TCO2: 36 mmol/L — ABNORMAL HIGH (ref 22–32)
TCO2: 37 mmol/L — ABNORMAL HIGH (ref 22–32)
pCO2 arterial: 32.8 mmHg (ref 32.0–48.0)
pCO2 arterial: 38.8 mmHg (ref 32.0–48.0)
pH, Arterial: 7.633 (ref 7.350–7.450)
pH, Arterial: 7.572 — ABNORMAL HIGH (ref 7.350–7.450)
pO2, Arterial: 82 mmHg — ABNORMAL LOW (ref 83.0–108.0)
pO2, Arterial: 114 mmHg — ABNORMAL HIGH (ref 83.0–108.0)

## 2019-09-22 LAB — COMPREHENSIVE METABOLIC PANEL
ALT: 25 U/L (ref 0–44)
AST: 18 U/L (ref 15–41)
Albumin: 2.4 g/dL — ABNORMAL LOW (ref 3.5–5.0)
Alkaline Phosphatase: 81 U/L (ref 38–126)
Anion gap: 10 (ref 5–15)
BUN: 19 mg/dL (ref 6–20)
CO2: 33 mmol/L — ABNORMAL HIGH (ref 22–32)
Calcium: 9.4 mg/dL (ref 8.9–10.3)
Chloride: 95 mmol/L — ABNORMAL LOW (ref 98–111)
Creatinine, Ser: <0.3 mg/dL — ABNORMAL LOW (ref 0.61–1.24)
Glucose, Bld: 102 mg/dL — ABNORMAL HIGH (ref 70–99)
Potassium: 4.3 mmol/L (ref 3.5–5.1)
Sodium: 138 mmol/L (ref 135–145)
Total Bilirubin: 0.3 mg/dL (ref 0.3–1.2)
Total Protein: 7.6 g/dL (ref 6.5–8.1)

## 2019-09-22 LAB — RAPID URINE DRUG SCREEN, HOSP PERFORMED
Amphetamines: NOT DETECTED
Barbiturates: NOT DETECTED
Benzodiazepines: NOT DETECTED
Cocaine: NOT DETECTED
Opiates: NOT DETECTED
Tetrahydrocannabinol: NOT DETECTED

## 2019-09-22 LAB — AMMONIA: Ammonia: 44 umol/L — ABNORMAL HIGH (ref 9–35)

## 2019-09-22 LAB — URINALYSIS, ROUTINE W REFLEX MICROSCOPIC
Bilirubin Urine: NEGATIVE
Glucose, UA: NEGATIVE mg/dL
Hgb urine dipstick: NEGATIVE
Ketones, ur: NEGATIVE mg/dL
Leukocytes,Ua: NEGATIVE
Nitrite: NEGATIVE
Protein, ur: 100 mg/dL — AB
Specific Gravity, Urine: 1.028 (ref 1.005–1.030)
pH: 5 (ref 5.0–8.0)

## 2019-09-22 LAB — PROCALCITONIN: Procalcitonin: 0.19 ng/mL

## 2019-09-22 LAB — BASIC METABOLIC PANEL
Anion gap: 10 (ref 5–15)
BUN: 18 mg/dL (ref 6–20)
CO2: 32 mmol/L (ref 22–32)
Calcium: 8.8 mg/dL — ABNORMAL LOW (ref 8.9–10.3)
Chloride: 98 mmol/L (ref 98–111)
Creatinine, Ser: <0.3 mg/dL — ABNORMAL LOW (ref 0.61–1.24)
Glucose, Bld: 92 mg/dL (ref 70–99)
Potassium: 3.4 mmol/L — ABNORMAL LOW (ref 3.5–5.1)
Sodium: 140 mmol/L (ref 135–145)

## 2019-09-22 LAB — RESPIRATORY PANEL BY RT PCR (FLU A&B, COVID)
Influenza A by PCR: NEGATIVE
Influenza B by PCR: NEGATIVE
SARS Coronavirus 2 by RT PCR: NEGATIVE

## 2019-09-22 LAB — MRSA PCR SCREENING: MRSA by PCR: NEGATIVE

## 2019-09-22 LAB — ACETAMINOPHEN LEVEL: Acetaminophen (Tylenol), Serum: <10 ug/mL — ABNORMAL LOW (ref 10–30)

## 2019-09-22 LAB — CBC
HCT: 26.9 % — ABNORMAL LOW (ref 39.0–52.0)
Hemoglobin: 8 g/dL — ABNORMAL LOW (ref 13.0–17.0)
MCH: 28.1 pg (ref 26.0–34.0)
MCHC: 29.7 g/dL — ABNORMAL LOW (ref 30.0–36.0)
MCV: 94.4 fL (ref 80.0–100.0)
Platelets: 646 10*3/uL — ABNORMAL HIGH (ref 150–400)
RBC: 2.85 MIL/uL — ABNORMAL LOW (ref 4.22–5.81)
RDW: 16.2 % — ABNORMAL HIGH (ref 11.5–15.5)
WBC: 9.4 10*3/uL (ref 4.0–10.5)
nRBC: 0 % (ref 0.0–0.2)

## 2019-09-22 LAB — TROPONIN I (HIGH SENSITIVITY)
Troponin I (High Sensitivity): 4 ng/L (ref <18)
Troponin I (High Sensitivity): 3 ng/L (ref <18)

## 2019-09-22 LAB — GLUCOSE, CAPILLARY
Glucose-Capillary: 93 mg/dL (ref 70–99)
Glucose-Capillary: 133 mg/dL — ABNORMAL HIGH (ref 70–99)
Glucose-Capillary: 119 mg/dL — ABNORMAL HIGH (ref 70–99)
Glucose-Capillary: 122 mg/dL — ABNORMAL HIGH (ref 70–99)

## 2019-09-22 LAB — MAGNESIUM: Magnesium: 1.9 mg/dL (ref 1.7–2.4)

## 2019-09-22 LAB — PHOSPHORUS: Phosphorus: 1.5 mg/dL — ABNORMAL LOW (ref 2.5–4.6)

## 2019-09-22 LAB — HIV ANTIBODY (ROUTINE TESTING W REFLEX): HIV Screen 4th Generation wRfx: NONREACTIVE

## 2019-09-22 LAB — SALICYLATE LEVEL: Salicylate Lvl: <7 mg/dL — ABNORMAL LOW (ref 7.0–30.0)

## 2019-09-22 MED ORDER — PIPERACILLIN-TAZOBACTAM 3.375 G IVPB 30 MIN
3.3750 g | Freq: Once | INTRAVENOUS | Status: AC
  Administered 2019-09-22: 3.375 g via INTRAVENOUS
  Filled 2019-09-22: qty 50

## 2019-09-22 MED ORDER — SODIUM CHLORIDE 0.9 % IV SOLN: 1.0000 g | Freq: Once | INTRAVENOUS | Status: DC

## 2019-09-22 MED ORDER — SERTRALINE HCL 100 MG PO TABS
100.0000 mg | ORAL_TABLET | Freq: Every day | ORAL | Status: DC
  Administered 2019-09-22 – 2019-09-23 (×2): 100 mg
  Filled 2019-09-22 (×2): qty 1
  Filled 2019-09-22 (×2): qty 2

## 2019-09-22 MED ORDER — TRAZODONE HCL 50 MG PO TABS: 50.0000 mg | ORAL_TABLET | Freq: Every day | ORAL | Status: DC

## 2019-09-22 MED ORDER — MIDODRINE HCL 5 MG PO TABS
5.0000 mg | ORAL_TABLET | Freq: Three times a day (TID) | ORAL | Status: DC
  Administered 2019-09-22 – 2019-09-23 (×5): 5 mg
  Filled 2019-09-22 (×6): qty 1

## 2019-09-22 MED ORDER — GABAPENTIN 250 MG/5ML PO SOLN
300.0000 mg | Freq: Three times a day (TID) | ORAL | Status: DC
  Administered 2019-09-22 – 2019-09-23 (×5): 300 mg
  Filled 2019-09-22 (×6): qty 6

## 2019-09-22 MED ORDER — CHLORHEXIDINE GLUCONATE CLOTH 2 % EX PADS
6.0000 | MEDICATED_PAD | Freq: Every day | CUTANEOUS | Status: DC
  Administered 2019-09-23: 6 via TOPICAL

## 2019-09-22 MED ORDER — FAMOTIDINE 40 MG/5ML PO SUSR
20.0000 mg | Freq: Two times a day (BID) | ORAL | Status: DC
  Administered 2019-09-22 – 2019-09-23 (×3): 20 mg
  Filled 2019-09-22 (×4): qty 2.5

## 2019-09-22 MED ORDER — VANCOMYCIN HCL IN DEXTROSE 1-5 GM/200ML-% IV SOLN
1000.0000 mg | Freq: Two times a day (BID) | INTRAVENOUS | Status: DC
  Administered 2019-09-22 (×2): 1000 mg via INTRAVENOUS
  Filled 2019-09-22 (×2): qty 200

## 2019-09-22 MED ORDER — JUVEN PO PACK
1.0000 | PACK | Freq: Two times a day (BID) | ORAL | Status: DC
  Administered 2019-09-22 – 2019-09-23 (×4): 1
  Filled 2019-09-22 (×4): qty 1

## 2019-09-22 MED ORDER — SODIUM CHLORIDE 0.9 % IV SOLN
INTRAVENOUS | Status: DC | PRN
  Administered 2019-09-22: 200 mL via INTRAVENOUS

## 2019-09-22 MED ORDER — FOLIC ACID 1 MG PO TABS
1.0000 mg | ORAL_TABLET | Freq: Every day | ORAL | Status: DC
  Administered 2019-09-22 – 2019-09-23 (×2): 1 mg
  Filled 2019-09-22 (×2): qty 1

## 2019-09-22 MED ORDER — LACTATED RINGERS IV SOLN: INTRAVENOUS | Status: AC

## 2019-09-22 MED ORDER — PRO-STAT SUGAR FREE PO LIQD
30.0000 mL | Freq: Every day | ORAL | Status: DC
  Administered 2019-09-22 – 2019-09-23 (×2): 30 mL
  Filled 2019-09-22 (×2): qty 30

## 2019-09-22 MED ORDER — ACETAMINOPHEN 325 MG PO TABS
650.0000 mg | ORAL_TABLET | ORAL | Status: DC | PRN
  Filled 2019-09-22: qty 2

## 2019-09-22 MED ORDER — CHLORHEXIDINE GLUCONATE 0.12% ORAL RINSE (MEDLINE KIT)
15.0000 mL | Freq: Two times a day (BID) | OROMUCOSAL | Status: DC
  Administered 2019-09-22 – 2019-09-23 (×3): 15 mL via OROMUCOSAL

## 2019-09-22 MED ORDER — ORAL CARE MOUTH RINSE
15.0000 mL | OROMUCOSAL | Status: DC
  Administered 2019-09-22 – 2019-09-23 (×13): 15 mL via OROMUCOSAL

## 2019-09-22 MED ORDER — BACLOFEN 5 MG HALF TABLET
5.0000 mg | ORAL_TABLET | Freq: Three times a day (TID) | ORAL | Status: DC | PRN
  Filled 2019-09-22: qty 1

## 2019-09-22 MED ORDER — SODIUM CHLORIDE 0.9 % IV BOLUS
500.0000 mL | Freq: Once | INTRAVENOUS | Status: AC
  Administered 2019-09-22: 500 mL via INTRAVENOUS

## 2019-09-22 MED ORDER — PIPERACILLIN-TAZOBACTAM 3.375 G IVPB
3.3750 g | Freq: Three times a day (TID) | INTRAVENOUS | Status: DC
  Administered 2019-09-22 – 2019-09-23 (×4): 3.375 g via INTRAVENOUS
  Filled 2019-09-22 (×4): qty 50

## 2019-09-22 MED ORDER — OSMOLITE 1.2 CAL PO LIQD
1000.0000 mL | ORAL | Status: DC
  Administered 2019-09-22: 1000 mL
  Filled 2019-09-22 (×2): qty 1000

## 2019-09-22 MED ORDER — ENOXAPARIN SODIUM 40 MG/0.4ML ~~LOC~~ SOLN
40.0000 mg | Freq: Every day | SUBCUTANEOUS | Status: DC
  Administered 2019-09-22 – 2019-09-23 (×2): 40 mg via SUBCUTANEOUS
  Filled 2019-09-22 (×2): qty 0.4

## 2019-09-22 MED ORDER — VANCOMYCIN HCL IN DEXTROSE 1-5 GM/200ML-% IV SOLN
1000.0000 mg | Freq: Once | INTRAVENOUS | Status: AC
  Administered 2019-09-22: 1000 mg via INTRAVENOUS
  Filled 2019-09-22: qty 200

## 2019-09-22 MED ORDER — RILUZOLE 50 MG PO TABS
50.0000 mg | ORAL_TABLET | Freq: Two times a day (BID) | ORAL | Status: DC
  Administered 2019-09-22 – 2019-09-23 (×3): 50 mg
  Filled 2019-09-22 (×5): qty 1

## 2019-09-22 MED ORDER — IPRATROPIUM-ALBUTEROL 0.5-2.5 (3) MG/3ML IN SOLN: 3.0000 mL | RESPIRATORY_TRACT | Status: DC | PRN

## 2019-09-22 MED ORDER — LACTULOSE 10 GM/15ML PO SOLN
10.0000 g | Freq: Two times a day (BID) | ORAL | Status: DC
  Administered 2019-09-22 (×2): 10 g
  Filled 2019-09-22 (×3): qty 15

## 2019-09-22 NOTE — Progress Notes (Signed)
Assisted tele visit to patient with wife.  Danil Wedge P, RN  

## 2019-09-22 NOTE — Progress Notes (Signed)
RT obtained ABG on pt with the following results. CCM Elink MD notified of critical pH of 7.633. MD changed vent rate order from his [facility] setting of 20 to a rate of 14. Pt respiratory status is stable at this time. RT will continue to monitor.   Results for Stuart Woods, Stuart Woods (MRN [id number]) as of 09/22/2019 04:57  Ref. Range 09/22/2019 04:50  Sample type Unknown ARTERIAL  pH, Arterial Latest Ref Range: 7.350 - 7.450  7.633 (HH)  pCO2 arterial Latest Ref Range: 32.0 - 48.0 mmHg 32.8  pO2, Arterial Latest Ref Range: 83.0 - 108.0 mmHg 82.0 (L)  TCO2 Latest Ref Range: 22 - 32 mmol/L 36 (H)  Acid-Base Excess Latest Ref Range: 0.0 - 2.0 mmol/L 13.0 (H)  Bicarbonate Latest Ref Range: 20.0 - 28.0 mmol/L 34.8 (H)  O2 Saturation Latest Units: % 98.0  Patient temperature Unknown 97.7 F  Collection site Unknown RADIAL, ALLEN'S TEST ACCEPTABLE

## 2019-09-22 NOTE — Progress Notes (Signed)
Initial Nutrition Assessment  RD working remotely.  DOCUMENTATION CODES:   Underweight, suspect severe malnutrition but RD unable to confirm at this time  INTERVENTION:   Initiate tube feeds via PEG: - Osmolite 1.2 @ 50 ml/hr (1200 ml/day) - Pro-stat 30 ml daily - Free water per MD  Tube feeding regimen provides 1540 kcal, 82 grams of protein, and 984 ml of H2O.  - 1 packet Juven BID per tube, each packet provides 95 calories, 2.5 grams of protein, and 9.8 grams of carbohydrate; also contains L-arginine and L-glutamine, vitamin C, vitamin E, vitamin B-12, zinc, calcium, and calcium Beta-hydroxy-Beta-methylbutyrate to support wound healing  NUTRITION DIAGNOSIS:   Inadequate oral intake related to inability to eat as evidenced by NPO status.  GOAL:   Patient will meet greater than or equal to 90% of their needs  MONITOR:   Labs, Weight trends, TF tolerance, Skin, I & O's  REASON FOR ASSESSMENT:   Consult Enteral/tube feeding initiation and management  ASSESSMENT:   54 year old male who presented to the ED on 1/17 from [facility] with increased AMS. PMH of ALS with trach/PEG dependence, vent dependence, degenerative joint disease.   RD consulted for tube feeding initiation and management.  No weight history available in chart. Given BMI ov 15.35, suspect severe malnutrition but RD unable to confirm at this time without NFPE.  RD will order Juven to aid in wound healing given multiple pressure injuries.  Patient is currently on ventilator support via trach. MV: 5.8 L/min Temp (24hrs), Avg:97.9 F (36.6 C), Min:97.7 F (36.5 C), Max:98.2 F (36.8 C) BP (cuff): 89/60 MAP (cuff): 70  Propofol: none  Medications reviewed and include: Pepcid, folic acid, lactulose, IV abx  Labs reviewed: magnesium 1.5, hemoglobin 8.5 CBG's: 97  NUTRITION - FOCUSED PHYSICAL EXAM:  Unable to complete at this time. RD working remotely.  Diet Order:   Diet Order            Diet NPO time specified  Diet effective now              EDUCATION NEEDS:   No education needs have been identified at this time  Skin:  Skin Assessment: Skin Integrity Issues: Stage I: right upper back, left knee Stage II: coccyx, bilateral buttocks  Last BM:  no documented BM  Height:   Ht Readings from Last 1 Encounters:  09/22/19 5\' 11"  (1.803 m)    Weight:   Wt Readings from Last 1 Encounters:  09/22/19 49.9 kg    Ideal Body Weight:  78.2 kg  BMI:  Body mass index is 15.34 kg/m.  Estimated Nutritional Needs:   Kcal:  1471  Protein:  70-85 grams  Fluid:  >/= 1.5 L    09/24/19, MS, RD, LDN Inpatient Clinical Dietitian Pager: [phone] Weekend/After Hours: [phone]

## 2019-09-22 NOTE — H&P (Signed)
NAME:  Stuart Woods, MRN:  [id number], DOB:  [birth year] LOS: 0 ADMISSION DATE:  09/21/2019, CONSULTATION DATE:  09/22/2019 REFERRING MD:  ED, CHIEF COMPLAINT:  Hypotension   Brief History   54 year old man with history of ALS with trach/PEG dependence, degenerative joint disease, and is a resident of [facility] presenting with acute encephalopathy.   History of present illness   54 year old man with history of ALS with trach/PEG dependence, degenerative joint disease, and is a resident of [facility] presenting with acute encephalopathy. Encephalopathy has been over the last day. History obtained from EMR due to patient's acute encephalopathy. His ALS was diagnosed about 3 years ago and had trach and PEG placement on 02/06/2019. He is vent dependent. Baseline appears to be nonverbal but able to nod and follow simple commands. Recently had vanc/mero for ventilator associated pneumonia at beginning of month.   Past Medical History  ALS with trach/PEG dependence, degenerative joint disease, GERD  Significant Hospital Events   Admit 1/18  Consults:  None  Procedures:  None  Significant Diagnostic Tests:  CT head w/o contrast 1/18: No acute intracranial abnormality. Cerebral atrophy, greater than expected for the patient's age.  CXR 1/17: Persistent but improving left lower lobe consolidation.  Micro Data:  Blood cx 1/18> Resp panel 1/18> Resp cx 1/18>  Antimicrobials:  Vanc 1/18> Zosyn 1/18>  Interim history/subjective:  Nonverbal, not following commands  Objective   Blood pressure 94/65, pulse (!) 102, temperature 97.9 F (36.6 C), temperature source Temporal, resp. rate (!) 22, SpO2 96 %.    Vent Mode: PRVC FiO2 (%):  [35 %] 35 % Set Rate:  [20 bmp] 20 bmp Vt Set:  [440 mL] 440 mL PEEP:  [5 cmH20] 5 cmH20 Plateau Pressure:  [14 cmH20-16 cmH20] 16 cmH20  No intake or output data in the 24 hours ending 09/22/19 0222 There were no vitals  filed for this visit.  Examination: General: NAD HENT: [place], trach in place Lungs: Clear without wheezes Cardiovascular: Mild tachycardia, but regular Abdomen: Soft, nontender, nondistended, PEG in place without surround erythema Extremities: No LE edema Neuro: Awake and alert but does not follow commands, opening mouth intermittently, tracks with eyes  Assessment & Plan:  54 year old man with history of ALS with trach/PEG dependence, degenerative joint disease, and is a resident of [facility] presenting with acute encephalopathy.   Acute encephalopathy: Afebrile. Was tachycardic to 103 with HR now in the 90s. BP MAPs in the 70s. O2 sat in the 90s on 35% FiO2. WBC 12.7 and was 12.3 12 days ago. Trop neg and EKG without acute ischemic changes. UDS, salicylate, and APAP neg. Ammonia mildly elevated at 44. UA does not appear to have acute infection. CXR with LLL consolidation that appears improved from prior. CT head with no acute intracranial abnormality --Follow up blood cultures, respiratory panel, trach aspirate --Lactulose scheduled --He received vanc and zosyn in the ED. Will check procalcitonin. Cont vanc and zosyn for now. --Received NS in ED. Will continue fluids overnight as he appears dry on exam. LR @ 138ml/hr for 5 hours. --Follow up trop --Continue facility midodrine  Chronic metabolic alkalosis: CO2 33. Baseline around 31.  --Check VBG  Chronic anemia: Hgb 9.3 and previously 8.4 12 days ago.  ALS with vent and PEG dependence --Dietician consult --Continue vent support --Continue home gabapentin, riluzole and baclofen prn  Depression: continue home Zoloft and trazodone  Best practice:  Diet: NPO Pain/Anxiety/Delirium protocol (if indicated): N/A VAP protocol (  if indicated): Ordered DVT prophylaxis: Lovenox ppx GI prophylaxis: Pepcid Glucose control: Monitor Mobility: As tolerated Code Status: Full per facility Family Communication: None at  bedside. Unable to reach spouse by phone Disposition: ICU  Labs   CBC: Recent Labs  Lab 09/21/19 2315  WBC 12.7*  NEUTROABS 10.5*  HGB 9.3*  HCT 31.8*  MCV 94.4  PLT 735*    Basic Metabolic Panel: Recent Labs  Lab 09/21/19 2315  NA 138  K 4.3  CL 95*  CO2 33*  GLUCOSE 102*  BUN 19  CREATININE <0.30*  CALCIUM 9.4   GFR: CrCl cannot be calculated (This lab value cannot be used to calculate CrCl because it is not a number: <0.30). Recent Labs  Lab 09/21/19 2315  WBC 12.7*    Liver Function Tests: Recent Labs  Lab 09/21/19 2315  AST 18  ALT 25  ALKPHOS 81  BILITOT 0.3  PROT 7.6  ALBUMIN 2.4*    Recent Labs  Lab 09/21/19 2315  AMMONIA 44*   CBG: Recent Labs  Lab 09/21/19 2316  GLUCAP 97    Review of Systems:   Unable to obtain due to encephalopathy  Past Medical History  He,  has a past medical history of ALS (amyotrophic lateral sclerosis) ([place]).   Surgical History    Past Surgical History:  Procedure Laterality Date  . GASTROSTOMY TUBE PLACEMENT    . TRACHEOSTOMY       Social History      Family History   Family history of Alzheimer disease  Allergies No Known Allergies   Home Medications   No current facility-administered medications on file prior to encounter.   Current Outpatient Medications on File Prior to Encounter  Medication Sig Dispense Refill  . acetaminophen (TYLENOL) 500 MG tablet Place 500 mg into feeding tube 2 (two) times daily.    Marland Kitchen ascorbic acid (VITAMIN C) 500 MG tablet Place 500 mg into feeding tube daily.    . Baclofen 5 MG TABS Give 2.5 mg by tube every 8 (eight) hours as needed (cramps/spasms).    . bisacodyl (DULCOLAX) 10 MG suppository Place 10 mg rectally daily as needed for moderate constipation.    . chlorhexidine (PERIDEX) 0.12 % solution Use as directed 15 mLs in the mouth or throat 2 (two) times daily.    . diphenhydrAMINE (BANOPHEN) 25 MG tablet 25 mg every 8 (eight) hours as needed for  allergies (PER TUBE).    Marland Kitchen docusate (COLACE) 50 MG/5ML liquid Place 100 mg into feeding tube daily.    Marland Kitchen enoxaparin (LOVENOX) 30 MG/0.3ML injection Inject 30 mg into the skin daily.    . folic acid (FOLVITE) 1 MG tablet Place 2 mg into feeding tube daily.    Marland Kitchen gabapentin (NEURONTIN) 300 MG capsule Place 300 mg into feeding tube 3 (three) times daily.    Marland Kitchen ipratropium-albuterol (DUONEB) 0.5-2.5 (3) MG/3ML SOLN Take 3 mLs by nebulization every 4 (four) hours as needed (SOB/Wheezing).    . Melatonin 3 MG TABS Give 3 mg by tube at bedtime.    . midodrine (PROAMATINE) 5 MG tablet Place 5 mg into feeding tube every 8 (eight) hours.    . Multiple Vitamins-Minerals (MULTIVITAMIN WITH MINERALS) tablet Take 1 tablet by mouth daily.    . ondansetron (ZOFRAN) 4 MG tablet Place 4 mg into feeding tube every 6 (six) hours as needed for nausea or vomiting.    . polyethylene glycol (MIRALAX / GLYCOLAX) 17 g packet Place 17 g into feeding  tube daily as needed for moderate constipation.    . Probiotic Product (PROBIOTIC ADVANCED PO) Give 1 capsule by tube daily. Ultimate Flora 30 billion    . riluzole (RILUTEK) 50 MG tablet Place 50 mg into feeding tube every 12 (twelve) hours.    Marland Kitchen scopolamine (TRANSDERM-SCOP, 1.5 MG,) 1 MG/3DAYS Place 1 patch onto the skin every 3 (three) days.    . sennosides (SENOKOT) 8.8 MG/5ML syrup Place 5 mLs into feeding tube 2 (two) times daily.    . sertraline (ZOLOFT) 100 MG tablet Place 100 mg into feeding tube daily.    . simethicone (MYLICON) 80 MG chewable tablet Chew 160 mg by mouth every 8 (eight) hours.    . traZODone (DESYREL) 50 MG tablet Place 50 mg into feeding tube at bedtime.    Marland Kitchen zinc sulfate 220 (50 Zn) MG capsule Take 220 mg by mouth daily.      Critical care time: The patient is critically ill with multiple organ systems failure and requires high complexity decision making for assessment and support, frequent evaluation and titration of therapies, application of  advanced monitoring technologies and extensive interpretation of multiple databases.   Critical Care Time devoted to patient care services described in this note is  50 Minutes. This time reflects time of care of this signee. This critical care time does not reflect procedure time, or teaching time or supervisory time of PA/NP/Med student/Med Resident etc but could involve care discussion time.  Griffin Basil, M.D. [facility] Pulmonary/Critical Care Medicine After hours pager: [phone].

## 2019-09-22 NOTE — Progress Notes (Signed)
eLink Physician-Brief Progress Note Patient Name: Stuart Woods DOB: 09-06-1965 MRN: [id number]   Date of Service  09/22/2019  HPI/Events of Note  ABG on 35%/PRVC 20/TV 440/P5 = 7.633/32.8/82.0.  eICU Interventions  Will order: 1. Decrease PRVC rate to 14. 2. Repeat ABG at 7:30 AM.     Intervention Category Major Interventions: Acid-Base disturbance - evaluation and management;Respiratory failure - evaluation and management  Stuart Woods [name] 09/22/2019, 5:03 AM

## 2019-09-22 NOTE — Progress Notes (Signed)
Pharmacy Antibiotic Note  Stuart Woods is a 54 y.o. male admitted on 09/21/2019 with hypotension, possible sepsis.  Pharmacy has been consulted for Vancomycin and Zosyn dosing.  Plan: Vancomycin 1 g IV q12h Zosyn 3.375 g IV q8h      Temp (24hrs), Avg:97.9 F (36.6 C), Min:97.9 F (36.6 C), Max:97.9 F (36.6 C)  Recent Labs  Lab 09/21/19 2315  WBC 12.7*  CREATININE <0.30*    CrCl cannot be calculated (This lab value cannot be used to calculate CrCl because it is not a number: <0.30).    No Known Allergies   Eddie Candle 09/22/2019 4:00 AM

## 2019-09-22 NOTE — Progress Notes (Signed)
Spoke with patients wife Irving Burton on the phone. I offered her an ELINK call to see her husband. She was happy to do that. She would love a call from an MD with updates if possible. Thanks. Ilani Otterson, [place] Scrape, RN

## 2019-09-22 NOTE — Progress Notes (Signed)
Patient transported from ED Trauma A to CT and back with no complications. 

## 2019-09-22 NOTE — ED Notes (Signed)
Pt transported to CT ?

## 2019-09-23 DIAGNOSIS — G9340 Encephalopathy, unspecified: Secondary | ICD-10-CM

## 2019-09-23 DIAGNOSIS — L8990 Pressure ulcer of unspecified site, unspecified stage: Secondary | ICD-10-CM | POA: Insufficient documentation

## 2019-09-23 LAB — GLUCOSE, CAPILLARY
Glucose-Capillary: 123 mg/dL — ABNORMAL HIGH (ref 70–99)
Glucose-Capillary: 111 mg/dL — ABNORMAL HIGH (ref 70–99)
Glucose-Capillary: 139 mg/dL — ABNORMAL HIGH (ref 70–99)

## 2019-09-23 MED ORDER — SODIUM CHLORIDE 0.9% FLUSH: 10.0000 mL | Freq: Two times a day (BID) | INTRAVENOUS | Status: DC

## 2019-09-23 MED ORDER — POTASSIUM PHOSPHATES 15 MMOLE/5ML IV SOLN
30.0000 mmol | Freq: Once | INTRAVENOUS | Status: AC
  Administered 2019-09-23: 30 mmol via INTRAVENOUS
  Filled 2019-09-23: qty 10

## 2019-09-23 MED ORDER — SODIUM CHLORIDE 0.9% FLUSH: 10.0000 mL | INTRAVENOUS | Status: DC | PRN

## 2019-09-23 MED ORDER — ACETAMINOPHEN 325 MG PO TABS
650.0000 mg | ORAL_TABLET | ORAL | Status: DC | PRN
  Administered 2019-09-23: 650 mg

## 2019-09-23 NOTE — TOC Initial Note (Signed)
Transition of Care [facility]) - Initial/Assessment Note    Patient Details  Name: Stuart Woods MRN: [id number] Date of Birth: 04-01-1966  [facility]) CM/SW Contact:    Kingsley Plan, RN Phone Number: 09/23/2019, 12:09 PM  Clinical Narrative:                 Patient from [facility]. Ready to return. Chronic trach, PEG and PICC. Vent FIO2 35% via trach collar.   Spoke to [place] at [facility], she can accept patient back today to room 304 Dr Sharlett Iles call report to [phone].   Misty Stanley will need discharge summary and covid result dated 09/22/19 faxed to her, she does not need FL2.   Spoke to patient regarding going back to [facility] today, he nodded yes. Called spouse Stuart Woods at [phone] and she is aware. She is requesting NP or MD call her. Joni Reining NP attempted this am and no answer and voicemail bax full and currently she is in ED but will give her message.   Canary Brim NP aware.  NCM will do medical necessity form and bedside nurse and NP will do EMTALA for [facility].     Expected Discharge Plan: Skilled Nursing Facility Barriers to Discharge: No Barriers Identified   Patient Goals and CMS Choice Patient states their goals for this hospitalization and ongoing recovery are:: back to SNF CMS Medicare.gov Compare Post Acute Care list provided to:: Patient Choice offered to / list presented to : Patient, Spouse  Expected Discharge Plan and Services Expected Discharge Plan: Skilled Nursing Facility                         DME Arranged: N/A         HH Arranged: NA          Prior Living Arrangements/Services     Patient language and need for interpreter reviewed:: Yes Do you feel safe going back to the place where you live?: Yes            Criminal Activity/Legal Involvement Pertinent to Current Situation/Hospitalization: No - Comment as needed  Activities of Daily Living      Permission Sought/Granted   Permission  granted to share information with : Yes, Verbal Permission Granted  Share Information with NAME: Stuart Woods [phone]           Emotional Assessment Appearance:: Appears stated age       Alcohol / Substance Use: Not Applicable Psych Involvement: No (comment)  Admission diagnosis:  Acute encephalopathy [G93.40] HCAP (healthcare-associated pneumonia) [J18.9] Urinary tract infection without hematuria, site unspecified [N39.0] Altered mental status, unspecified altered mental status type [R41.82] Patient Active Problem List   Diagnosis Date Noted  . Pressure injury of skin 09/23/2019  . Acute encephalopathy 09/22/2019   PCP:  System, Pcp Not In Pharmacy:  No Pharmacies Listed    Social Determinants of Health (SDOH) Interventions    Readmission Risk Interventions No flowsheet data found.

## 2019-09-23 NOTE — Progress Notes (Signed)
Report given to RN at [facility]. [facility] called. Pt updated. Halvor Behrend, [place] Scrape, RN

## 2019-09-23 NOTE — Progress Notes (Signed)
PCCM Interval Note  [facility] has accepted patient. I have called patient's wife but no answer. Unable to leave voicemail.  Mechele Collin, M.D. [facility] Pulmonary/Critical Care Medicine 09/23/2019 12:30 PM

## 2019-09-23 NOTE — Progress Notes (Signed)
RT note-Patient transported back to [facility] by [facility], ventilator discontinued.

## 2019-09-23 NOTE — Discharge Summary (Signed)
Physician Discharge Summary  Patient ID: Stuart Woods MRN: [id number] DOB/AGE: [birth year] 54 y.o.  Admit date: 09/21/2019 Discharge date: 09/23/2019    Discharge Diagnoses:  Acute Metabolic Encephalopathy  Hypotension  Chronic Respiratory Failure secondary to ALS  Chronic Metabolic Alkalosis  Hypophosphatemia  Hypokalemia Elevated Ammonia  Anemia  Pressure Injury  ALS  Depression                                                                  DISCHARGE PLAN BY DIAGNOSIS        Acute Metabolic Encephalopathy  Afebrile, no leukocytosis, neg troponin, EKG.  UDS, APAP, salicylate negative. CXR improved from prior. UA negative.  CT head negative. Likely mild hypotension, alkalosis contributing to changes on admit.   Discharge Plan: -supportive care -promote normal sleep / wake cycle -no clear source infection, stop antibiotics  -follow cultures to maturity   Hypotension  Discharge Plan: -continue midodrine PT  -follow BP trends  Chronic Respiratory Failure secondary to ALS   Discharge Plan: -PRVC, Vt 440 (slightly less than 6cc/kg), rate 12 as patient overbreathes  -avoid overventilation  -wean PEEP / FiO2 for sats > 90% -follow intermittent ABG -consider stopping scopolamine as can lead to poor secretion clearance / mucus plugging  Chronic Metabolic Alkalosis  Baseline ~ 31.  Likely related to neuromuscular weakness / chronic CO2 retention but is on chronic bowel regimen, ? If contributing to chronic diarrhea & alkalosis, phosphorous levels low on admit as well.  We have not seen diarrhea here, 1 BM during this admit thus far.    Discharge Plan: -follow BMP trend -reduce bowel regimen as needed for 1 BM per day, avoid diarrhea  Hypophosphatemia Hypokalemia   Discharge Plan: -kphos 1/19 -follow up phos in am -may need supplementation PT pending trends   Mild Elevation of Ammonia   Discharge Plan: -follow up ammonia 1/20 -hold further lactulose    Anemia   Discharge Plan:  -trend CBC  -transfuse for Hgb <7%, active bleeding or MI <8%   Pressure Injury   Discharge Plan: -Bilateral sacral, left knee, left scapula.  Present on admit.  -wound care per WOC -pressure relief measures  ALS  Discharge Plan: -continue riluzole, gabapentin, baclofen PRN                      DISCHARGE SUMMARY   54 y/o M with ALS s/p trach / PEG / vent dependence who presented 1/17 from [facility] with altered mental status & hypotension.  The patient resides at [place].  He was treated earlier in the month for ventilator associated PNA.   Initial work up included - Na 138, K 4.3, Cl 95, CO2 33, BUN 19, sr Cr <0.30, albumin 2.4, ammonia 44, WBC 12.7, Hgb 9.3 and platelets 735.  CXR showed improvement in LLL airspace disease. CT of the head was negative.  HIV, COVID, Influenza A/B negative. MRSA PCR negative.  EKG, troponin negative. UA negative.  UDS negative, salicylate, acetaminophen negative.  ABG assessed and showed chronic metabolic alkalosis and respiratory alkalosis (7.633 / 32.8 / 82 / 34.8).  The patient was admitted for further evaluation. He was noted on admit to have multiple pressure injuries.  He was treated empirically with vancomycin and zosyn.  PCT 0.19.  IVF's administered in the ER with improvement in blood pressure (lowest initial recorded BP in 02'M systolic).  Midodrine was continued throughout admit.  Ventilator settings were adjusted based on ABG findings. Mental status improved to baseline. Blood cultures and tracheal aspirate pending at time of discharge. The patient was medically cleared for discharge 1/19 with plans as above.             SIGNIFICANT DIAGNOSTIC STUDIES CT Head 1/18 >> negative   MICRO DATA  COVID PCR 1/18 >> negative  Influenza A/B 1/18 >> negative  BCx2 1/18 >>  Tracheal Aspirate 1/18 >>   ANTIBIOTICS Vanco 1/18 >> 1/19  Zosyn 1/18 >> 1/19  CONSULTS   TUBES / LINES Chronic PEG >>  Chronic Trach  >>    Discharge Exam: General: chronically ill appearing male lying in bed  HEENT: mucus membranes dry, crusted, lids/lashes normal, anicteric, trach midline c/d/i Neuro: eyes open, nods yes/no, moves head up/down, generalized muscle atrophy / wasting consistent with neuromuscular disease CV: s1s2 RRR, no m/r/g PULM:  Non-labored, overbreathing vent, lungs bilaterally coarse, no wheezing/crackles  GI: soft, bsx4 active, PEG in place  Extremities: warm/dry, no edema, atrophy as above   Skin: no rashes or lesions   Vitals:   09/23/19 0800 09/23/19 0900 09/23/19 1000 09/23/19 1100  BP: 130/90 125/80 125/74 115/66  Pulse: 88 83 86 94  Resp: _0 Temp: (!) 97.3 F (36.3 C)     TempSrc: Axillary     SpO2: 99% 99% 99% 97%  Weight:      Height:         Discharge Labs  BMET Recent Labs  Lab 09/21/19 2315 09/21/19 2315 09/22/19 0450 09/22/19 0450 09/22/19 0547 09/22/19 0823  NA 138  --  140  --  140 139  K 4.3   < > 3.5   < > 3.4* 3.5  CL 95*  --   --   --  98  --   CO2 33*  --   --   --  32  --   GLUCOSE 102*  --   --   --  92  --   BUN 19  --   --   --  18  --   CREATININE <0.30*  --   --   --  <0.30*  --   CALCIUM 9.4  --   --   --  8.8*  --   MG  --   --   --   --  1.9  --   PHOS  --   --   --   --  1.5*  --    < > = values in this interval not displayed.    CBC Recent Labs  Lab 09/21/19 2315 09/21/19 2315 09/22/19 0450 09/22/19 0547 09/22/19 0823  HGB 9.3*   < > 10.9* 8.0* 8.5*  HCT 31.8*   < > 32.0* 26.9* 25.0*  WBC 12.7*  --   --  9.4  --   PLT 735*  --   --  646*  --    < > = values in this interval not displayed.    Anti-Coagulation No results for input(s): INR in the last 168 hours.  Discharge Instructions    Bed rest   Complete by: As directed    Call MD for:  difficulty breathing, headache or visual disturbances   Complete by: As directed    Call MD for:  extreme fatigue   Complete by: As directed    Call MD for:  persistant  dizziness or light-headedness   Complete by: As directed    Call MD for:  persistant nausea and vomiting   Complete by: As directed    Call MD for:  redness, tenderness, or signs of infection (pain, swelling, redness, odor or green/yellow discharge around incision site)   Complete by: As directed    Call MD for:  severe uncontrolled pain   Complete by: As directed    Call MD for:  temperature >100.4   Complete by: As directed        Follow-up Information    [facility] up.   Specialty: Skilled Nursing Engineer, manufacturing information: [place]. [place] [address] [phone]           Allergies as of 09/23/2019   No Known Allergies     Medication List    TAKE these medications   acetaminophen 500 MG tablet Commonly known as: TYLENOL Place 500 mg into feeding tube 2 (two) times daily.   ascorbic acid 500 MG tablet Commonly known as: VITAMIN C Place 500 mg into feeding tube daily.   Baclofen 5 MG Tabs Give 2.5 mg by tube every 8 (eight) hours as needed (cramps/spasms).   Banophen 25 MG tablet Generic drug: diphenhydrAMINE 25 mg every 8 (eight) hours as needed for allergies (PER TUBE).   bisacodyl 10 MG suppository Commonly known as: DULCOLAX Place 10 mg rectally daily as needed for moderate constipation.   chlorhexidine 0.12 % solution Commonly known as: PERIDEX Use as directed 15 mLs in the mouth or throat 2 (two) times daily.   docusate 50 MG/5ML liquid Commonly known as: COLACE Place 100 mg into feeding tube daily.   enoxaparin 30 MG/0.3ML injection Commonly known as: LOVENOX Inject 30 mg into the skin daily.   folic acid 1 MG tablet Commonly known as: FOLVITE Place 2 mg into feeding tube daily.   gabapentin 300 MG capsule Commonly known as: NEURONTIN Place 300 mg into feeding tube 3 (three) times daily.   ipratropium-albuterol 0.5-2.5 (3) MG/3ML Soln Commonly known as: DUONEB Take 3 mLs  by nebulization every 4 (four) hours as needed (SOB/Wheezing).   Melatonin 3 MG Tabs Give 3 mg by tube at bedtime.   midodrine 5 MG tablet Commonly known as: PROAMATINE Place 5 mg into feeding tube every 8 (eight) hours.   multivitamin with minerals tablet Take 1 tablet by mouth daily.   ondansetron 4 MG tablet Commonly known as: ZOFRAN Place 4 mg into feeding tube every 6 (six) hours as needed for nausea or vomiting.   polyethylene glycol 17 g packet Commonly known as: MIRALAX / GLYCOLAX Place 17 g into feeding tube daily as needed for moderate constipation.   PROBIOTIC ADVANCED PO Give 1 capsule by tube daily. Ultimate Flora 30 billion   riluzole 50 MG tablet Commonly known as: RILUTEK Place 50 mg into feeding tube every 12 (twelve) hours.   sennosides 8.8 MG/5ML syrup Commonly known as: SENOKOT Place 5 mLs into feeding tube 2 (two) times daily.   sertraline 100 MG tablet Commonly known as: ZOLOFT Place 100 mg into feeding tube daily.   simethicone 80 MG chewable tablet Commonly known as: MYLICON Chew 115 mg by mouth every 8 (eight) hours.   Transderm-Scop (1.5 MG) 1 MG/3DAYS Generic drug: scopolamine Place 1 patch onto the skin every 3 (three) days.   traZODone 50 MG tablet  Commonly known as: DESYREL Place 50 mg into feeding tube at bedtime.   zinc sulfate 220 (50 Zn) MG capsule Take 220 mg by mouth daily.        Disposition: [facility]   Discharged Condition: Briscoe Daniello has met maximum benefit of inpatient care and is medically stable and cleared for discharge.  Patient is pending follow up as above.      Time spent on disposition:  30 Minutes.   Signed: Noe Gens, NP-C [facility] & [facility] Pgr: [phone] Office: [phone]

## 2019-09-23 NOTE — Progress Notes (Addendum)
NAME:  Stuart Woods, MRN:  [id number], DOB:  [birth year], LOS: 1 ADMISSION DATE:  09/21/2019, CONSULTATION DATE:  09/21/18 REFERRING MD:  Dr. Randal Buba, CHIEF COMPLAINT:  Hypotension    Brief History   54 y/o M with ALS s/p trach / PEG / vent dependence who presented 1/17 from [facility] with altered mental status & hypotension.  The patient resides at [name].  He was treated earlier in the month for ventilator associated PNA.    Past Medical History  ALS - s/p trach, vent dependent, PEG.  Baseline non-verbal, moves head / nods, mouths a couple words  Significant Hospital Events   1/17 Admit   Consults:  PCCM   Procedures:  Chronic Trach >>  Chronic PEG >>   Significant Diagnostic Tests:  CT Head 1/18 >> no acute intracranial abnormality, cerebral atrophy   Micro Data:  COVID PCR 1/18 >> negative  Influenza A/B 1/18 >> negative  BCx2 1/18 >>  Tracheal Aspirate 1/18 >>   Antimicrobials:  Vanco 1/18 >> 1/19 Zosyn 1/18 >> 1/19  Interim history/subjective:  Afebrile.   BMx1  Mental status appears to be at baseline No acute events overnight  Objective   Blood pressure 121/75, pulse 88, temperature 97.7 F (36.5 C), temperature source Axillary, resp. rate 15, height 5\' 11"  (1.803 m), weight 49.8 kg, SpO2 99 %.    Vent Mode: PRVC FiO2 (%):  [35 %] 35 % Set Rate:  [14 bmp] 14 bmp Vt Set:  [440 mL] 440 mL PEEP:  [5 cmH20] 5 cmH20 Plateau Pressure:  [15 cmH20-19 cmH20] 16 cmH20   Intake/Output Summary (Last 24 hours) at 09/23/2019 0818 Last data filed at 09/23/2019 0800 Gross per 24 hour  Intake 1602.55 ml  Output 850 ml  Net 752.55 ml   Filed Weights   09/22/19 0342 09/22/19 0400 09/23/19 0500  Weight: 48.6 kg 49.9 kg 49.8 kg    Examination: General: chronically ill appearing male lying in bed  HEENT: mucus membranes dry, crusted, lids/lashes normal, anicteric, trach midline c/d/i Neuro: eyes open, nods yes/no, moves head up/down, generalized muscle atrophy /  wasting consistent with neuromuscular disease CV: s1s2 RRR, no m/r/g PULM:  Non-labored, overbreathing vent, lungs bilaterally coarse, no wheezing/crackles  GI: soft, bsx4 active, PEG in place  Extremities: warm/dry, no edema, atrophy as above   Skin: no rashes or lesions  Resolved Hospital Problem list     Assessment & Plan:   Acute Metabolic Encephalopathy  Afebrile, no leukocytosis, neg troponin, EKG.  UDS, APAP, salicylate negative. CXR improved from prior.  UA negative.  CT head negative. Likely mild hypotension, alkalosis contributing to changes on admit.  -appears to have resolved  -supportive care -promote normal sleep / wake cycle -no clear source infection, stop antibiotics  -follow cultures to maturity   Hypotension -continue midodrine PT  -follow BP trends  Chronic Respiratory Failure secondary to ALS  -PRVC, Vt 440 (less than 6cc/kg), rate 12 as patient overbreathes  -wean PEEP / FiO2 for sats > 90% -follow intermittent ABG -consider stopping scopolamine as can lead to poor secretion clearance / mucus plugging  Chronic Metabolic Alkalosis  Baseline ~ 31.  Likely related to neuromuscular weakness / chronic CO2 retention but is on chronic bowel regimen, ? If contributing to chronic diarrhea & alkalosis, phosphorous levels low on admit as well.  We have not seen diarrhea here, 1 BM during this admit thus far.   -follow BMP trend -reduce bowel regimen as needed for 1 BM per day,  avoid diarrhea  Hypophosphatemia Hypokalemia  -kphos 1/19 -follow up phos in am -may need supplementation PT pending trends   Mild Elevation of Ammonia  -hold further lactulose   Anemia  -trend CBC  -transfuse per ICU guidelines   Pressure Injury  Bilateral sacral, left knee, left scapula.  Present on admit.   ALS -continue riluzole, gabapentin, baclofen PRN   Depression  -continue zoloft, trazodone  Best practice:  Diet: TF, NPO  Pain/Anxiety/Delirium protocol (if  indicated): n/a VAP protocol (if indicated): In place DVT prophylaxis: enoxaparin  GI prophylaxis: pepcid  Glucose control: n/a Mobility: BR  Code Status: Full Code  Family Communication: Wife Irving Burton) called but went to voicemail.  Unable to leave a message as mailbox was full.  Disposition: ICU, ok to return to [facility] when bed available.   Labs   CBC: Recent Labs  Lab 09/21/19 2315 09/22/19 0450 09/22/19 0547 09/22/19 0823  WBC 12.7*  --  9.4  --   NEUTROABS 10.5*  --   --   --   HGB 9.3* 10.9* 8.0* 8.5*  HCT 31.8* 32.0* 26.9* 25.0*  MCV 94.4  --  94.4  --   PLT 735*  --  646*  --     Basic Metabolic Panel: Recent Labs  Lab 09/21/19 2315 09/22/19 0450 09/22/19 0547 09/22/19 0823  NA 138 140 140 139  K 4.3 3.5 3.4* 3.5  CL 95*  --  98  --   CO2 33*  --  32  --   GLUCOSE 102*  --  92  --   BUN 19  --  18  --   CREATININE <0.30*  --  <0.30*  --   CALCIUM 9.4  --  8.8*  --   MG  --   --  1.9  --   PHOS  --   --  1.5*  --    GFR: CrCl cannot be calculated (This lab value cannot be used to calculate CrCl because it is not a number: <0.30). Recent Labs  Lab 09/21/19 2315 09/22/19 0547  PROCALCITON  --  0.19  WBC 12.7* 9.4    Liver Function Tests: Recent Labs  Lab 09/21/19 2315  AST 18  ALT 25  ALKPHOS 81  BILITOT 0.3  PROT 7.6  ALBUMIN 2.4*   No results for input(s): LIPASE, AMYLASE in the last 168 hours. Recent Labs  Lab 09/21/19 2315  AMMONIA 44*    ABG    Component Value Date/Time   PHART 7.572 (H) 09/22/2019 0823   PCO2ART 38.8 09/22/2019 0823   PO2ART 114.0 (H) 09/22/2019 0823   HCO3 35.8 (H) 09/22/2019 0823   TCO2 37 (H) 09/22/2019 0823   O2SAT 99.0 09/22/2019 0823     Coagulation Profile: No results for input(s): INR, PROTIME in the last 168 hours.  Cardiac Enzymes: No results for input(s): CKTOTAL, CKMB, CKMBINDEX, TROPONINI in the last 168 hours.  HbA1C: No results found for: HGBA1C  CBG: Recent Labs  Lab 09/22/19 1601  09/22/19 1935 09/22/19 2318 09/23/19 0330 09/23/19 0745  GLUCAP 133* 119* 122* 123* 111*      Critical care time: 30 minutes     Canary Brim, MSN, NP-C [facility] 09/23/2019, 8:18 AM   Please see [email or web address] for pager details.

## 2019-09-26 LAB — CULTURE, RESPIRATORY W GRAM STAIN

## 2019-09-27 LAB — CULTURE, BLOOD (ROUTINE X 2)
Culture: NO GROWTH
Culture: NO GROWTH

## 2019-10-16 ENCOUNTER — Other Ambulatory Visit: Payer: Self-pay

## 2019-10-16 ENCOUNTER — Emergency Department (HOSPITAL_COMMUNITY)
Admission: EM | Admit: 2019-10-16 | Discharge: 2019-10-16 | Disposition: A | Discharge status: Home / Self Care | Payer: BC Managed Care – PPO | Attending: Emergency Medicine | Admitting: Emergency Medicine

## 2019-10-16 DIAGNOSIS — L89159 Pressure ulcer of sacral region, unspecified stage: Secondary | ICD-10-CM | POA: Diagnosis not present

## 2019-10-16 DIAGNOSIS — G1221 Amyotrophic lateral sclerosis: Secondary | ICD-10-CM | POA: Diagnosis not present

## 2019-10-16 DIAGNOSIS — Z5189 Encounter for other specified aftercare: Secondary | ICD-10-CM

## 2019-10-16 DIAGNOSIS — Z931 Gastrostomy status: Secondary | ICD-10-CM | POA: Insufficient documentation

## 2019-10-16 DIAGNOSIS — Z79899 Other long term (current) drug therapy: Secondary | ICD-10-CM | POA: Diagnosis not present

## 2019-10-16 DIAGNOSIS — Z9911 Dependence on respirator [ventilator] status: Secondary | ICD-10-CM | POA: Diagnosis not present

## 2019-10-16 DIAGNOSIS — Z7401 Bed confinement status: Secondary | ICD-10-CM | POA: Insufficient documentation

## 2019-10-16 LAB — CBC WITH DIFFERENTIAL/PLATELET
Abs Immature Granulocytes: 0.18 10*3/uL — ABNORMAL HIGH (ref 0.00–0.07)
Basophils Absolute: 0.1 10*3/uL (ref 0.0–0.1)
Basophils Relative: 1 %
Eosinophils Absolute: 0.3 10*3/uL (ref 0.0–0.5)
Eosinophils Relative: 2 %
HCT: 31.4 % — ABNORMAL LOW (ref 39.0–52.0)
Hemoglobin: 9.4 g/dL — ABNORMAL LOW (ref 13.0–17.0)
Immature Granulocytes: 1 %
Lymphocytes Relative: 15 %
Lymphs Abs: 2.2 10*3/uL (ref 0.7–4.0)
MCH: 28.7 pg (ref 26.0–34.0)
MCHC: 29.9 g/dL — ABNORMAL LOW (ref 30.0–36.0)
MCV: 95.7 fL (ref 80.0–100.0)
Monocytes Absolute: 0.9 10*3/uL (ref 0.1–1.0)
Monocytes Relative: 6 %
Neutro Abs: 11.2 10*3/uL — ABNORMAL HIGH (ref 1.7–7.7)
Neutrophils Relative %: 75 %
Platelets: 694 10*3/uL — ABNORMAL HIGH (ref 150–400)
RBC: 3.28 MIL/uL — ABNORMAL LOW (ref 4.22–5.81)
RDW: 15.9 % — ABNORMAL HIGH (ref 11.5–15.5)
WBC: 14.9 10*3/uL — ABNORMAL HIGH (ref 4.0–10.5)
nRBC: 0 % (ref 0.0–0.2)

## 2019-10-16 LAB — ABO/RH: ABO/RH(D): O POS

## 2019-10-16 LAB — COMPREHENSIVE METABOLIC PANEL
ALT: 19 U/L (ref 0–44)
AST: 15 U/L (ref 15–41)
Albumin: 2.7 g/dL — ABNORMAL LOW (ref 3.5–5.0)
Alkaline Phosphatase: 78 U/L (ref 38–126)
Anion gap: 8 (ref 5–15)
BUN: 44 mg/dL — ABNORMAL HIGH (ref 6–20)
CO2: 28 mmol/L (ref 22–32)
Calcium: 9.2 mg/dL (ref 8.9–10.3)
Chloride: 105 mmol/L (ref 98–111)
Creatinine, Ser: <0.3 mg/dL — ABNORMAL LOW (ref 0.61–1.24)
Glucose, Bld: 132 mg/dL — ABNORMAL HIGH (ref 70–99)
Potassium: 4 mmol/L (ref 3.5–5.1)
Sodium: 141 mmol/L (ref 135–145)
Total Bilirubin: 0.3 mg/dL (ref 0.3–1.2)
Total Protein: 7.7 g/dL (ref 6.5–8.1)

## 2019-10-16 LAB — PROTIME-INR
INR: 1.1 (ref 0.8–1.2)
Prothrombin Time: 13.6 seconds (ref 11.4–15.2)

## 2019-10-16 LAB — TYPE AND SCREEN
ABO/RH(D): O POS
Antibody Screen: NEGATIVE

## 2019-10-16 NOTE — ED Triage Notes (Signed)
Pt brought to ED via [facility] from [facility]. Staff reported patient had a recent wound debridement in the right upper hip/gluteal area. Staff stated dressings were completely soaked through with blood and clots this AM upon assessment. [facility] reports new dressings were clean and dry on arrival to SNF. Pt is currently alert, on trach vent. Nausea patch present behind left ear. VSS.

## 2019-10-16 NOTE — Discharge Instructions (Addendum)
There was no evidence of bleeding when Stuart Woods arrived to the emergency department. His hemoglobin was stable today at 9.4. Please consider holding his Lovenox doses for the time being. He should see his physician for recheck in the next 24 hours.

## 2019-10-16 NOTE — ED Notes (Signed)
Called [facility] per Dr. Madilyn Hook

## 2019-10-16 NOTE — ED Provider Notes (Signed)
[facility] EMERGENCY DEPARTMENT Provider Note   CSN: [id number] Arrival date & time: 10/16/19  1312     History Chief Complaint  Patient presents with  . Wound Check    Stuart Woods is a 54 y.o. male.  The history is provided by the patient, the EMS personnel, medical records and the nursing home. No language interpreter was used.  Wound Check   Stuart Woods is a 54 y.o. male who presents to the Emergency Department complaining of wound check.  He presents to the ED for evaluation of bleeding from his sacral wound. He did have a debridement last week. He is a resident at [facility] in [place]. He has a history of ALS and is ventilator dependent. He has chronic sacral wounds and is on Lovenox BID prophylactically. His last agreement was about one week ago. This morning his dressing change went well but this afternoon when he had a dressing change there was a large amount of blood present with large clots. Nursing was concerned and referred him to the emergency department for further evaluation.  [facility].  Blood everywhere at 1pm.  BID dressing changes.      Past Medical History:  Diagnosis Date  . ALS (amyotrophic lateral sclerosis) [facility])     Patient Active Problem List   Diagnosis Date Noted  . Pressure injury of skin 09/23/2019  . Acute encephalopathy 09/22/2019    Past Surgical History:  Procedure Laterality Date  . GASTROSTOMY TUBE PLACEMENT    . TRACHEOSTOMY         No family history on file.  Social History   Tobacco Use  . Smoking status: Not on file  Substance Use Topics  . Alcohol use: Not on file  . Drug use: Not on file    Home Medications Prior to Admission medications   Medication Sig Start Date End Date Taking? Authorizing Provider  acetaminophen (TYLENOL) 500 MG tablet Place 500 mg into feeding tube 2 (two) times daily.    [provider]  ascorbic acid (VITAMIN C) 500 MG  tablet Place 500 mg into feeding tube daily.    [provider]  Baclofen 5 MG TABS Give 2.5 mg by tube every 8 (eight) hours as needed (cramps/spasms).    [provider]  bisacodyl (DULCOLAX) 10 MG suppository Place 10 mg rectally daily as needed for moderate constipation.    [provider]  chlorhexidine (PERIDEX) 0.12 % solution Use as directed 15 mLs in the mouth or throat 2 (two) times daily.    [provider]  diphenhydrAMINE (BANOPHEN) 25 MG tablet 25 mg every 8 (eight) hours as needed for allergies (PER TUBE).    [provider]  docusate (COLACE) 50 MG/5ML liquid Place 100 mg into feeding tube daily.    [provider]  enoxaparin (LOVENOX) 30 MG/0.3ML injection Inject 30 mg into the skin daily.    [provider]  folic acid (FOLVITE) 1 MG tablet Place 2 mg into feeding tube daily.    [provider]  gabapentin (NEURONTIN) 300 MG capsule Place 300 mg into feeding tube 3 (three) times daily.    [provider]  ipratropium-albuterol (DUONEB) 0.5-2.5 (3) MG/3ML SOLN Take 3 mLs by nebulization every 4 (four) hours as needed (SOB/Wheezing).    [provider]  Melatonin 3 MG TABS Give 3 mg by tube at bedtime.    [provider]  midodrine (PROAMATINE) 5 MG tablet  Place 5 mg into feeding tube every 8 (eight) hours.    [provider]  Multiple Vitamins-Minerals (MULTIVITAMIN WITH MINERALS) tablet Take 1 tablet by mouth daily.    [provider]  ondansetron (ZOFRAN) 4 MG tablet Place 4 mg into feeding tube every 6 (six) hours as needed for nausea or vomiting.    [provider]  polyethylene glycol (MIRALAX / GLYCOLAX) 17 g packet Place 17 g into feeding tube daily as needed for moderate constipation.    [provider]  Probiotic Product (PROBIOTIC ADVANCED PO) Give 1 capsule by tube daily. Ultimate Flora 30 billion    [provider]  riluzole  (RILUTEK) 50 MG tablet Place 50 mg into feeding tube every 12 (twelve) hours.    [provider]  scopolamine (TRANSDERM-SCOP, 1.5 MG,) 1 MG/3DAYS Place 1 patch onto the skin every 3 (three) days.    [provider]  sennosides (SENOKOT) 8.8 MG/5ML syrup Place 5 mLs into feeding tube 2 (two) times daily.    [provider]  sertraline (ZOLOFT) 100 MG tablet Place 100 mg into feeding tube daily.    [provider]  simethicone (MYLICON) 80 MG chewable tablet Chew 160 mg by mouth every 8 (eight) hours.    [provider]  traZODone (DESYREL) 50 MG tablet Place 50 mg into feeding tube at bedtime.    [provider]  zinc sulfate 220 (50 Zn) MG capsule Take 220 mg by mouth daily.    [provider]    Allergies    Patient has no known allergies.  Review of Systems   Review of Systems  All other systems reviewed and are negative.   Physical Exam Updated Vital Signs BP (!) 133/92   Pulse (!) 104   Temp 98.6 F (37 C)   Resp 20   SpO2 96%   Physical Exam Vitals and nursing note reviewed.  Constitutional:      Appearance: He is well-developed.  HENT:     Head: Normocephalic and atraumatic.  Neck:     Comments: Tracheostomy in place Cardiovascular:     Rate and Rhythm: Regular rhythm. Tachycardia present.  Pulmonary:     Effort: Pulmonary effort is normal. No respiratory distress.     Comments: Course breath sounds bilaterally. Abdominal:     Palpations: Abdomen is soft.     Tenderness: There is no abdominal tenderness. There is no guarding or rebound.     Comments: Peg tube in the left upper quadrant.  Genitourinary:    Comments: There is a deep right gluteal wound without surrounding erythema or edema. There is no active bleeding. Musculoskeletal:        General: No tenderness.     Comments: Contractures all four extremities  Skin:    General: Skin is warm and dry.  Neurological:     Mental Status: He is alert.      Comments: Awake and alert. Nods yes and no to questions. Nonverbal. Paralysis to all four extremities  Psychiatric:        Behavior: Behavior normal.     ED Results / Procedures / Treatments   Labs (all labs ordered are listed, but only abnormal results are displayed) Labs Reviewed  COMPREHENSIVE METABOLIC PANEL - Abnormal; Notable for the following components:      Result Value   Glucose, Bld 132 (*)    BUN 44 (*)    Creatinine, Ser <0.30 (*)    Albumin 2.7 (*)  All other components within normal limits  CBC WITH DIFFERENTIAL/PLATELET - Abnormal; Notable for the following components:   WBC 14.9 (*)    RBC 3.28 (*)    Hemoglobin 9.4 (*)    HCT 31.4 (*)    MCHC 29.9 (*)    RDW 15.9 (*)    Platelets 694 (*)    Neutro Abs 11.2 (*)    Abs Immature Granulocytes 0.18 (*)    All other components within normal limits  PROTIME-INR  TYPE AND SCREEN  ABO/RH    EKG None  Radiology No results found.  Procedures Procedures (including critical care time)  Medications Ordered in ED Medications - No data to display  ED Course  I have reviewed the triage vital signs and the nursing notes.  Pertinent labs & imaging results that were available during my care of the patient were reviewed by me and considered in my medical decision making (see chart for details).    MDM Rules/Calculators/A&P                     Patient here from [facility] for evaluation of bleeding from a sacral wound. Wound is hemostatic on evaluation in the emergency department. Hemoglobin is stable. There is no current evidence of infection or hemorrhage. Plan to discharge back to [facility] facility. Attempted to contact wife and provide update but there was no answer.  Final Clinical Impression(s) / ED Diagnoses Final diagnoses:  Visit for wound check    Rx / DC Orders ED Discharge Orders    None       Tilden Fossa, MD 10/16/19 1623

## 2019-12-31 DIAGNOSIS — Z930 Tracheostomy status: Secondary | ICD-10-CM

## 2019-12-31 DIAGNOSIS — J9621 Acute and chronic respiratory failure with hypoxia: Secondary | ICD-10-CM

## 2019-12-31 DIAGNOSIS — G1221 Amyotrophic lateral sclerosis: Secondary | ICD-10-CM

## 2020-01-12 DIAGNOSIS — J9621 Acute and chronic respiratory failure with hypoxia: Secondary | ICD-10-CM

## 2020-01-12 DIAGNOSIS — G1221 Amyotrophic lateral sclerosis: Secondary | ICD-10-CM

## 2020-01-12 DIAGNOSIS — Z930 Tracheostomy status: Secondary | ICD-10-CM

## 2020-01-14 DIAGNOSIS — J9621 Acute and chronic respiratory failure with hypoxia: Secondary | ICD-10-CM

## 2020-01-14 DIAGNOSIS — G1221 Amyotrophic lateral sclerosis: Secondary | ICD-10-CM

## 2020-01-14 DIAGNOSIS — Z930 Tracheostomy status: Secondary | ICD-10-CM

## 2020-01-16 DIAGNOSIS — Z930 Tracheostomy status: Secondary | ICD-10-CM

## 2020-01-16 DIAGNOSIS — G1221 Amyotrophic lateral sclerosis: Secondary | ICD-10-CM

## 2020-01-16 DIAGNOSIS — J9621 Acute and chronic respiratory failure with hypoxia: Secondary | ICD-10-CM

## 2020-01-26 DIAGNOSIS — J9621 Acute and chronic respiratory failure with hypoxia: Secondary | ICD-10-CM

## 2020-01-26 DIAGNOSIS — Z930 Tracheostomy status: Secondary | ICD-10-CM

## 2020-01-26 DIAGNOSIS — G1221 Amyotrophic lateral sclerosis: Secondary | ICD-10-CM

## 2020-01-28 DIAGNOSIS — J9621 Acute and chronic respiratory failure with hypoxia: Secondary | ICD-10-CM

## 2020-01-28 DIAGNOSIS — G1221 Amyotrophic lateral sclerosis: Secondary | ICD-10-CM

## 2020-01-28 DIAGNOSIS — Z930 Tracheostomy status: Secondary | ICD-10-CM

## 2020-02-08 ENCOUNTER — Other Ambulatory Visit: Payer: Self-pay

## 2020-02-08 ENCOUNTER — Emergency Department (HOSPITAL_COMMUNITY): Payer: BC Managed Care – PPO

## 2020-02-08 ENCOUNTER — Observation Stay (HOSPITAL_COMMUNITY)
Admission: EM | Admit: 2020-02-08 | Discharge: 2020-02-09 | Disposition: A | Discharge status: Skilled Nursing Facility | Payer: BC Managed Care – PPO | Attending: Pulmonary Disease | Admitting: Pulmonary Disease

## 2020-02-08 DIAGNOSIS — K5900 Constipation, unspecified: Secondary | ICD-10-CM | POA: Diagnosis present

## 2020-02-08 DIAGNOSIS — K56600 Partial intestinal obstruction, unspecified as to cause: Principal | ICD-10-CM

## 2020-02-08 DIAGNOSIS — G1221 Amyotrophic lateral sclerosis: Secondary | ICD-10-CM

## 2020-02-08 DIAGNOSIS — K56609 Unspecified intestinal obstruction, unspecified as to partial versus complete obstruction: Secondary | ICD-10-CM | POA: Diagnosis present

## 2020-02-08 DIAGNOSIS — E440 Moderate protein-calorie malnutrition: Secondary | ICD-10-CM | POA: Insufficient documentation

## 2020-02-08 DIAGNOSIS — D72829 Elevated white blood cell count, unspecified: Secondary | ICD-10-CM | POA: Diagnosis not present

## 2020-02-08 DIAGNOSIS — L89154 Pressure ulcer of sacral region, stage 4: Secondary | ICD-10-CM | POA: Insufficient documentation

## 2020-02-08 DIAGNOSIS — J9610 Chronic respiratory failure, unspecified whether with hypoxia or hypercapnia: Secondary | ICD-10-CM | POA: Diagnosis not present

## 2020-02-08 DIAGNOSIS — R109 Unspecified abdominal pain: Secondary | ICD-10-CM | POA: Diagnosis present

## 2020-02-08 DIAGNOSIS — Z930 Tracheostomy status: Secondary | ICD-10-CM | POA: Diagnosis not present

## 2020-02-08 DIAGNOSIS — Z79899 Other long term (current) drug therapy: Secondary | ICD-10-CM | POA: Insufficient documentation

## 2020-02-08 DIAGNOSIS — Z20822 Contact with and (suspected) exposure to covid-19: Secondary | ICD-10-CM | POA: Diagnosis not present

## 2020-02-08 DIAGNOSIS — K5641 Fecal impaction: Secondary | ICD-10-CM | POA: Insufficient documentation

## 2020-02-08 DIAGNOSIS — Z9911 Dependence on respirator [ventilator] status: Secondary | ICD-10-CM

## 2020-02-08 DIAGNOSIS — J90 Pleural effusion, not elsewhere classified: Secondary | ICD-10-CM | POA: Insufficient documentation

## 2020-02-08 DIAGNOSIS — Z931 Gastrostomy status: Secondary | ICD-10-CM | POA: Insufficient documentation

## 2020-02-08 DIAGNOSIS — Z7901 Long term (current) use of anticoagulants: Secondary | ICD-10-CM | POA: Diagnosis not present

## 2020-02-08 DIAGNOSIS — J939 Pneumothorax, unspecified: Secondary | ICD-10-CM

## 2020-02-08 LAB — COMPREHENSIVE METABOLIC PANEL WITH GFR
ALT: 29 U/L (ref 0–44)
AST: 26 U/L (ref 15–41)
Albumin: 2.8 g/dL — ABNORMAL LOW (ref 3.5–5.0)
Alkaline Phosphatase: 101 U/L (ref 38–126)
Anion gap: 15 (ref 5–15)
BUN: 26 mg/dL — ABNORMAL HIGH (ref 6–20)
CO2: 20 mmol/L — ABNORMAL LOW (ref 22–32)
Calcium: 8.9 mg/dL (ref 8.9–10.3)
Chloride: 101 mmol/L (ref 98–111)
Creatinine, Ser: 0.33 mg/dL — ABNORMAL LOW (ref 0.61–1.24)
GFR calc Af Amer: >60 mL/min
GFR calc non Af Amer: >60 mL/min
Glucose, Bld: 74 mg/dL (ref 70–99)
Potassium: 4.1 mmol/L (ref 3.5–5.1)
Sodium: 136 mmol/L (ref 135–145)
Total Bilirubin: 1 mg/dL (ref 0.3–1.2)
Total Protein: 7.9 g/dL (ref 6.5–8.1)

## 2020-02-08 LAB — CBC WITH DIFFERENTIAL/PLATELET
Abs Immature Granulocytes: 0.07 10*3/uL (ref 0.00–0.07)
Basophils Absolute: 0 10*3/uL (ref 0.0–0.1)
Basophils Relative: 0 %
Eosinophils Absolute: 0.1 10*3/uL (ref 0.0–0.5)
Eosinophils Relative: 1 %
HCT: 35.9 % — ABNORMAL LOW (ref 39.0–52.0)
Hemoglobin: 11.1 g/dL — ABNORMAL LOW (ref 13.0–17.0)
Immature Granulocytes: 1 %
Lymphocytes Relative: 10 %
Lymphs Abs: 1.2 10*3/uL (ref 0.7–4.0)
MCH: 28 pg (ref 26.0–34.0)
MCHC: 30.9 g/dL (ref 30.0–36.0)
MCV: 90.4 fL (ref 80.0–100.0)
Monocytes Absolute: 0.5 10*3/uL (ref 0.1–1.0)
Monocytes Relative: 4 %
Neutro Abs: 10.1 10*3/uL — ABNORMAL HIGH (ref 1.7–7.7)
Neutrophils Relative %: 84 %
Platelets: 463 10*3/uL — ABNORMAL HIGH (ref 150–400)
RBC: 3.97 MIL/uL — ABNORMAL LOW (ref 4.22–5.81)
RDW: 17.9 % — ABNORMAL HIGH (ref 11.5–15.5)
WBC: 12 10*3/uL — ABNORMAL HIGH (ref 4.0–10.5)
nRBC: 0 % (ref 0.0–0.2)

## 2020-02-08 LAB — LACTIC ACID, PLASMA: Lactic Acid, Venous: 1 mmol/L (ref 0.5–1.9)

## 2020-02-08 LAB — SARS CORONAVIRUS 2 BY RT PCR (HOSPITAL ORDER, PERFORMED IN ~~LOC~~ HOSPITAL LAB): SARS Coronavirus 2: NEGATIVE

## 2020-02-08 NOTE — ED Triage Notes (Signed)
Pt bib [facility] from [facility]. Pt hx of ALS and trach, vent dependent. Per [facility] pt last bowel movement was last Thursday. Pt had a CT scan today that showed a partial SBO as well as a small pneumo. Pt in NAD. abd distended. VSS with [facility].

## 2020-02-08 NOTE — ED Provider Notes (Signed)
[facility] EMERGENCY DEPARTMENT Provider Note   CSN: [id number] Arrival date & time: 02/08/20  1938     History Chief Complaint  Patient presents with  . Constipation    Stuart Woods is a 54 y.o. male.  Patient with unfortunate ALS presents from [facility] nursing home for concern for partial bowel obstruction and small pneumothorax.  Patient CT scan outpatient/nursing home which showed these findings per report.  Patient presented via [facility] and last bowel movement was last Thursday.  No fevers or vomiting reported.        Past Medical History:  Diagnosis Date  . ALS (amyotrophic lateral sclerosis) [facility])     Patient Active Problem List   Diagnosis Date Noted  . Pressure injury of skin 09/23/2019  . Acute encephalopathy 09/22/2019    Past Surgical History:  Procedure Laterality Date  . GASTROSTOMY TUBE PLACEMENT    . TRACHEOSTOMY         No family history on file.  Social History   Tobacco Use  . Smoking status: Not on file  Substance Use Topics  . Alcohol use: Not on file  . Drug use: Not on file    Home Medications Prior to Admission medications   Medication Sig Start Date End Date Taking? Authorizing Provider  acetaminophen (TYLENOL) 500 MG tablet Place 500 mg into feeding tube every 12 (twelve) hours.    Yes [provider]  Amino Acids-Protein Hydrolys (FEEDING SUPPLEMENT, PRO-STAT SUGAR FREE 64,) LIQD Place 30 mLs into feeding tube daily.   Yes [provider]  Artificial Saliva (BIOTENE ORALBALANCE DRY MOUTH) GEL Use as directed 1 application in the mouth or throat 3 (three) times daily.   Yes [provider]  ascorbic acid (VITAMIN C) 500 MG tablet Place 500 mg into feeding tube daily.   Yes [provider]  bisacodyl (DULCOLAX) 10 MG suppository Place 10 mg rectally daily as needed for moderate constipation.   Yes [provider]  chlorhexidine (PERIDEX) 0.12 % solution Use as directed  15 mLs in the mouth or throat See admin instructions. Swab mouth with chlorhexidine every shift   Yes [provider]  diatrizoate meglumine-sodium (GASTROGRAFIN) 66-10 % solution Take 30 mLs by mouth once.   Yes [provider]  diphenhydrAMINE (BANOPHEN) 25 MG tablet 25 mg every 8 (eight) hours as needed for itching (PER TUBE).    Yes [provider]  docusate (COLACE) 50 MG/5ML liquid Place 100 mg into feeding tube every 8 (eight) hours.    Yes [provider]  enoxaparin (LOVENOX) 30 MG/0.3ML injection Inject 30 mg into the skin daily.   Yes [provider]  folic acid (FOLVITE) 1 MG tablet Place 2 mg into feeding tube daily.   Yes [provider]  gabapentin (NEURONTIN) 300 MG capsule Place 300 mg into feeding tube every 8 (eight) hours.    Yes [provider]  hydrocortisone cream 1 % Apply 1 application topically every 8 (eight) hours as needed for itching.   Yes [provider]  ipratropium-albuterol (DUONEB) 0.5-2.5 (3) MG/3ML SOLN Take 3 mLs by nebulization every 4 (four) hours as needed (shortness of breath/wheezing).    Yes [provider]  Melatonin 3 MG TABS Give 6 mg by tube at bedtime.    Yes [provider]  midodrine (PROAMATINE) 5 MG tablet Place 5 mg into feeding tube every 8 (eight) hours.   Yes [provider]  Multiple Vitamins-Minerals (MULTIVITAMIN WITH MINERALS) tablet  Place 1 tablet into feeding tube daily.    Yes [provider]  nutrition supplement, JUVEN, (JUVEN) PACK Place 1 packet into feeding tube 2 (two) times daily between meals.   Yes [provider]  Nutritional Supplements (COMPLEAT PEPTIDE 1.5) LIQD Place 1,300 mLs into feeding tube See admin instructions. Give at 55 ml/hr continuous for daily total of 1300 ml   Yes [provider]  nystatin (MYCOSTATIN) 100000 UNIT/ML suspension Use as directed 5 mLs in the mouth or throat See admin  instructions. Use foam sponge to apply to  Mouth/throat every 8 hours starting 01/19/2020 - suction mouth to prevent aspiration   Yes [provider]  ondansetron (ZOFRAN) 4 MG tablet Place 4 mg into feeding tube every 6 (six) hours as needed for nausea or vomiting.   Yes [provider]  oxyCODONE (OXY IR/ROXICODONE) 5 MG immediate release tablet Place 5 mg into feeding tube See admin instructions. Give one tablet (5 mg) per tube twice daily, also administer one tablet (5 mg) 30 minutes prior to wound care   Yes [provider]  pantoprazole sodium (PROTONIX) 40 mg/20 mL PACK Place 40 mg into feeding tube daily.   Yes [provider]  polyethylene glycol (MIRALAX / GLYCOLAX) 17 g packet Place 17 g into feeding tube daily as needed for moderate constipation.   Yes [provider]  Probiotic Product (PROBIOTIC ADVANCED PO) Place 1 capsule into feeding tube every 12 (twelve) hours. Ultimate Flora 30 billion    Yes [provider]  riluzole (RILUTEK) 50 MG tablet Place 50 mg into feeding tube every 12 (twelve) hours.   Yes [provider]  scopolamine (TRANSDERM-SCOP, 1.5 MG,) 1 MG/3DAYS Place 1 patch onto the skin every 3 (three) days.   Yes [provider]  sennosides (SENOKOT) 8.8 MG/5ML syrup Place 5 mLs into feeding tube every 12 (twelve) hours.    Yes [provider]  sertraline (ZOLOFT) 100 MG tablet Place 100 mg into feeding tube daily.   Yes [provider]  simethicone (MYLICON) 80 MG chewable tablet Place 160 mg into feeding tube every 8 (eight) hours.    Yes [provider]  sodium chloride flush 0.9 % SOLN injection Inject 10 mLs into the vein See admin instructions. Per shift   Yes [provider]  traZODone (DESYREL) 50 MG tablet Place 50 mg into feeding tube at bedtime.   Yes [provider]  Water For Irrigation, Sterile (FREE WATER) SOLN Place 200 mLs into feeding tube every  6 (six) hours.   Yes [provider]  zinc sulfate 220 (50 Zn) MG capsule Place 220 mg into feeding tube at bedtime.    Yes [provider]    Allergies    Patient has no known allergies.  Review of Systems   Review of Systems  Unable to perform ROS: Patient nonverbal    Physical Exam Updated Vital Signs BP 122/85   Pulse 79   Temp 98.2 F (36.8 C) (Oral)   Resp 17   SpO2 93%   Physical Exam Vitals and nursing note reviewed.  HENT:     Head: Normocephalic.     Comments: Trach in place, ventilator dependent    Nose: Nose normal.     Mouth/Throat:     Mouth: Mucous membranes are moist.  Eyes:     General:        Right eye: No discharge.        Left eye:  No discharge.     Conjunctiva/sclera: Conjunctivae normal.  Cardiovascular:     Rate and Rhythm: Normal rate.     Pulses: Normal pulses.  Abdominal:     General: There is distension.  Musculoskeletal:        General: Swelling (bilateral LE) present.     Cervical back: Neck supple.  Skin:    General: Skin is warm.     Capillary Refill: Capillary refill takes less than 2 seconds.  Neurological:     Mental Status: He is alert.     Comments: Patient with ALS, general weakness, no spontaneous movement of extremities, flat facies  Psychiatric:        Attention and Perception: Attention normal.     Comments: Follows with eye movements, alert, no verbal response     ED Results / Procedures / Treatments   Labs (all labs ordered are listed, but only abnormal results are displayed) Labs Reviewed  CBC WITH DIFFERENTIAL/PLATELET - Abnormal; Notable for the following components:      Result Value   WBC 12.0 (*)    RBC 3.97 (*)    Hemoglobin 11.1 (*)    HCT 35.9 (*)    RDW 17.9 (*)    Platelets 463 (*)    Neutro Abs 10.1 (*)    All other components within normal limits  SARS CORONAVIRUS 2 BY RT PCR (HOSPITAL ORDER, [place] [facility])  CULTURE, BLOOD (ROUTINE X 2)  CULTURE,  BLOOD (ROUTINE X 2)  COMPREHENSIVE METABOLIC PANEL  LACTIC ACID, PLASMA    EKG None  Radiology DG Chest Portable 1 View  Result Date: 02/08/2020 CLINICAL DATA:  Evaluate tracheostomy tube positioning. EXAM: PORTABLE CHEST 1 VIEW COMPARISON:  September 21, 2019 FINDINGS: A tracheostomy tube is seen with its distal tip noted at the level of the clavicles. There is stable right-sided PICC line positioning. Mild atelectasis and/or infiltrate is seen within the right lung base. There is no evidence of a pleural effusion or pneumothorax. The heart size and mediastinal contours are within normal limits. The visualized skeletal structures are unremarkable. IMPRESSION: 1. Tracheostomy tube with its distal tip at the level of the clavicles. 2. Mild right basilar atelectasis and/or infiltrate. Electronically Signed   By: Virgina Norfolk M.D.   On: 02/08/2020 21:21    Procedures Fecal disimpaction  Date/Time: 02/08/2020 11:30 PM Performed by: Elnora Morrison, MD Authorized by: Elnora Morrison, MD  Consent: The procedure was performed in an emergent situation. Patient identity confirmed: arm band Time out: Immediately prior to procedure a "time out" was called to verify the correct patient, procedure, equipment, support staff and site/side marked as required. Local anesthesia used: no  Anesthesia: Local anesthesia used: no Comments: Disimpaction with assistance from technicians  .Critical Care Performed by: Elnora Morrison, MD Authorized by: Elnora Morrison, MD   Critical care provider statement:    Critical care time (minutes):  35   Critical care start time:  02/08/2020 11:00 PM   Critical care end time:  02/08/2020 11:35 PM   Critical care time was exclusive of:  Separately billable procedures and treating other patients and teaching time   Critical care was necessary to treat or prevent imminent or life-threatening deterioration of the following conditions:  Respiratory failure   Critical care was  time spent personally by me on the following activities:  Discussions with consultants, examination of patient, ordering and performing treatments and interventions, ordering and review of laboratory studies, ordering and review of radiographic  studies, pulse oximetry, re-evaluation of patient's condition and review of old charts   (including critical care time)  Medications Ordered in ED Medications - No data to display  ED Course  I have reviewed the triage vital signs and the nursing notes.  Pertinent labs & imaging results that were available during my care of the patient were reviewed by me and considered in my medical decision making (see chart for details).    MDM Rules/Calculators/A&P                      Patient with unfortunate ALS presents from [facility] nursing facility for concerns for partial bowel obstruction and pneumothorax.  Patient has a trach and ventilator.  No fever, vital signs normal on arrival.    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  Report describing presentation October 12/admission discussed patient received intrathecal injection for a clinical trial and ended up failing BiPAP due to respiratory failure requiring ventilation mechanically and had complications with pneumonia.  Patient was unable to be weaned and tracheostomy was placed February 06, 2019 and was sent to [facility] long-term care facility.  Patient was admitted to [facility] facility June 16, 2019 and has not been able to be weaned on the vent.  Patient is on PEG tube feeds. Last CT scan results reviewed showing partial small bowel obstruction, large amount of rectal stool with impaction along with gallbladder sludge performed February 08, 2020 at 430 in addition showed large lower lung infiltrates. CT scan of thorax showed 10% pneumothorax in the right  with right and left lower infiltrates with small pleural effusion on the right.  Patient did have large liquid bowel movement, suspecting going around impacted stool.  Plan for manual disimpaction with nursing assistance. With patient having persistent abdominal distention and CT scan results reviewed, manual disimpaction performed.  I was able to remove some stool.    Patient's oxygen saturations gradually decreased to low 80s, respiratory consulted for airway suction.  Patient sats improved with suctioning, vent management per respiratory.  Discussed with critical care for further recommendations and admission.  Attempted to call patient's wife no response, phone listed Irving Burton [phone]. Blood work starting to return white blood cell count 12,000 hemoglobin 11.    Patient care signed out to follow up results and discuss with critical care for admission.     Final Clinical Impression(s) / ED Diagnoses Final diagnoses:  Pneumothorax, right  ALS (amyotrophic lateral sclerosis) (HCC)  Partial intestinal obstruction, unspecified cause (HCC)  Ventilator dependence (HCC)    Rx / DC Orders ED Discharge Orders    None       Blane Ohara, MD 02/08/20 2341

## 2020-02-09 ENCOUNTER — Inpatient Hospital Stay (HOSPITAL_COMMUNITY): Payer: BC Managed Care – PPO

## 2020-02-09 DIAGNOSIS — K56600 Partial intestinal obstruction, unspecified as to cause: Secondary | ICD-10-CM | POA: Diagnosis not present

## 2020-02-09 DIAGNOSIS — G1221 Amyotrophic lateral sclerosis: Secondary | ICD-10-CM

## 2020-02-09 DIAGNOSIS — K56609 Unspecified intestinal obstruction, unspecified as to partial versus complete obstruction: Secondary | ICD-10-CM | POA: Diagnosis present

## 2020-02-09 DIAGNOSIS — J939 Pneumothorax, unspecified: Secondary | ICD-10-CM | POA: Diagnosis not present

## 2020-02-09 DIAGNOSIS — R109 Unspecified abdominal pain: Secondary | ICD-10-CM | POA: Diagnosis present

## 2020-02-09 DIAGNOSIS — K5669 Other partial intestinal obstruction: Secondary | ICD-10-CM | POA: Diagnosis not present

## 2020-02-09 DIAGNOSIS — Z9911 Dependence on respirator [ventilator] status: Secondary | ICD-10-CM

## 2020-02-09 DIAGNOSIS — E440 Moderate protein-calorie malnutrition: Secondary | ICD-10-CM | POA: Insufficient documentation

## 2020-02-09 LAB — POCT I-STAT EG7
Acid-base deficit: 1 mmol/L (ref 0.0–2.0)
Bicarbonate: 22.3 mmol/L (ref 20.0–28.0)
Calcium, Ion: 1.2 mmol/L (ref 1.15–1.40)
HCT: 32 % — ABNORMAL LOW (ref 39.0–52.0)
Hemoglobin: 10.9 g/dL — ABNORMAL LOW (ref 13.0–17.0)
O2 Saturation: 93 %
Patient temperature: 98.6
Potassium: 3.8 mmol/L (ref 3.5–5.1)
Sodium: 135 mmol/L (ref 135–145)
TCO2: 23 mmol/L (ref 22–32)
pCO2, Ven: 32.5 mmHg — ABNORMAL LOW (ref 44.0–60.0)
pH, Ven: 7.444 — ABNORMAL HIGH (ref 7.250–7.430)
pO2, Ven: 64 mmHg — ABNORMAL HIGH (ref 32.0–45.0)

## 2020-02-09 LAB — BASIC METABOLIC PANEL
Anion gap: 16 — ABNORMAL HIGH (ref 5–15)
BUN: 20 mg/dL (ref 6–20)
CO2: 20 mmol/L — ABNORMAL LOW (ref 22–32)
Calcium: 8.8 mg/dL — ABNORMAL LOW (ref 8.9–10.3)
Chloride: 100 mmol/L (ref 98–111)
Creatinine, Ser: <0.3 mg/dL — ABNORMAL LOW (ref 0.61–1.24)
Glucose, Bld: 105 mg/dL — ABNORMAL HIGH (ref 70–99)
Potassium: 3.2 mmol/L — ABNORMAL LOW (ref 3.5–5.1)
Sodium: 136 mmol/L (ref 135–145)

## 2020-02-09 LAB — CBC
HCT: 33.4 % — ABNORMAL LOW (ref 39.0–52.0)
HCT: 34.6 % — ABNORMAL LOW (ref 39.0–52.0)
Hemoglobin: 10.2 g/dL — ABNORMAL LOW (ref 13.0–17.0)
Hemoglobin: 10.6 g/dL — ABNORMAL LOW (ref 13.0–17.0)
MCH: 27.6 pg (ref 26.0–34.0)
MCH: 27.4 pg (ref 26.0–34.0)
MCHC: 30.5 g/dL (ref 30.0–36.0)
MCHC: 30.6 g/dL (ref 30.0–36.0)
MCV: 90.3 fL (ref 80.0–100.0)
MCV: 89.4 fL (ref 80.0–100.0)
Platelets: 433 10*3/uL — ABNORMAL HIGH (ref 150–400)
Platelets: 453 10*3/uL — ABNORMAL HIGH (ref 150–400)
RBC: 3.7 MIL/uL — ABNORMAL LOW (ref 4.22–5.81)
RBC: 3.87 MIL/uL — ABNORMAL LOW (ref 4.22–5.81)
RDW: 17.9 % — ABNORMAL HIGH (ref 11.5–15.5)
RDW: 17.6 % — ABNORMAL HIGH (ref 11.5–15.5)
WBC: 12.5 10*3/uL — ABNORMAL HIGH (ref 4.0–10.5)
WBC: 18 10*3/uL — ABNORMAL HIGH (ref 4.0–10.5)
nRBC: 0 % (ref 0.0–0.2)
nRBC: 0 % (ref 0.0–0.2)

## 2020-02-09 LAB — PHOSPHORUS
Phosphorus: 4.1 mg/dL (ref 2.5–4.6)
Phosphorus: 3.4 mg/dL (ref 2.5–4.6)

## 2020-02-09 LAB — HEMOGLOBIN A1C
Hgb A1c MFr Bld: 5.3 % (ref 4.8–5.6)
Mean Plasma Glucose: 105.41 mg/dL

## 2020-02-09 LAB — MAGNESIUM
Magnesium: 1.9 mg/dL (ref 1.7–2.4)
Magnesium: 1.9 mg/dL (ref 1.7–2.4)

## 2020-02-09 LAB — CREATININE, SERUM: Creatinine, Ser: <0.3 mg/dL — ABNORMAL LOW (ref 0.61–1.24)

## 2020-02-09 LAB — PROCALCITONIN
Procalcitonin: <0.1 ng/mL
Procalcitonin: <0.1 ng/mL

## 2020-02-09 LAB — GLUCOSE, CAPILLARY
Glucose-Capillary: 82 mg/dL (ref 70–99)
Glucose-Capillary: 81 mg/dL (ref 70–99)
Glucose-Capillary: 113 mg/dL — ABNORMAL HIGH (ref 70–99)
Glucose-Capillary: 109 mg/dL — ABNORMAL HIGH (ref 70–99)

## 2020-02-09 LAB — MRSA PCR SCREENING: MRSA by PCR: NEGATIVE

## 2020-02-09 MED ORDER — GABAPENTIN 250 MG/5ML PO SOLN
300.0000 mg | Freq: Three times a day (TID) | ORAL | Status: DC
  Administered 2020-02-09 (×2): 300 mg
  Filled 2020-02-09 (×4): qty 6

## 2020-02-09 MED ORDER — PRO-STAT SUGAR FREE PO LIQD: 30.0000 mL | Freq: Every day | ORAL | Status: DC

## 2020-02-09 MED ORDER — SODIUM CHLORIDE 0.9% FLUSH: 10.0000 mL | INTRAVENOUS | Status: DC | PRN

## 2020-02-09 MED ORDER — VITAL HIGH PROTEIN PO LIQD: 1000.0000 mL | ORAL | Status: DC

## 2020-02-09 MED ORDER — POLYETHYLENE GLYCOL 3350 17 G PO PACK
17.0000 g | PACK | Freq: Every day | ORAL | Status: DC
  Filled 2020-02-09: qty 1

## 2020-02-09 MED ORDER — OSMOLITE 1.2 CAL PO LIQD
1000.0000 mL | ORAL | Status: DC
  Filled 2020-02-09: qty 1000

## 2020-02-09 MED ORDER — DOCUSATE SODIUM 50 MG/5ML PO LIQD: 100.0000 mg | Freq: Two times a day (BID) | ORAL | 0 refills | Status: DC

## 2020-02-09 MED ORDER — GABAPENTIN 100 MG PO CAPS: 300.0000 mg | ORAL_CAPSULE | Freq: Three times a day (TID) | ORAL | Status: DC

## 2020-02-09 MED ORDER — INSULIN ASPART 100 UNIT/ML ~~LOC~~ SOLN: 0.0000 [IU] | SUBCUTANEOUS | Status: DC

## 2020-02-09 MED ORDER — DOCUSATE SODIUM 50 MG/5ML PO LIQD: 100.0000 mg | Freq: Two times a day (BID) | ORAL | Status: DC | PRN

## 2020-02-09 MED ORDER — PRO-STAT SUGAR FREE PO LIQD
30.0000 mL | Freq: Two times a day (BID) | ORAL | Status: DC
  Administered 2020-02-09: 30 mL
  Filled 2020-02-09: qty 30

## 2020-02-09 MED ORDER — SORBITOL 70 % SOLN
30.0000 mL | Freq: Every day | Status: DC
  Administered 2020-02-09: 30 mL via ORAL
  Filled 2020-02-09: qty 30

## 2020-02-09 MED ORDER — FENTANYL CITRATE (PF) 100 MCG/2ML IJ SOLN
12.5000 ug | INTRAMUSCULAR | Status: DC | PRN
  Filled 2020-02-09: qty 2

## 2020-02-09 MED ORDER — METOCLOPRAMIDE HCL 5 MG/ML IJ SOLN
10.0000 mg | Freq: Four times a day (QID) | INTRAMUSCULAR | Status: DC
  Administered 2020-02-09: 10 mg via INTRAVENOUS
  Filled 2020-02-09: qty 2

## 2020-02-09 MED ORDER — RILUZOLE 50 MG PO TABS
50.0000 mg | ORAL_TABLET | Freq: Two times a day (BID) | ORAL | Status: DC
  Administered 2020-02-09: 50 mg
  Filled 2020-02-09 (×2): qty 1

## 2020-02-09 MED ORDER — DOCUSATE SODIUM 100 MG PO CAPS: 100.0000 mg | ORAL_CAPSULE | Freq: Two times a day (BID) | ORAL | Status: DC | PRN

## 2020-02-09 MED ORDER — POLYETHYLENE GLYCOL 3350 17 G PO PACK: 17.0000 g | PACK | Freq: Every day | ORAL | 0 refills | Status: AC

## 2020-02-09 MED ORDER — PANTOPRAZOLE SODIUM 40 MG IV SOLR
40.0000 mg | Freq: Every day | INTRAVENOUS | Status: DC
  Administered 2020-02-09: 40 mg via INTRAVENOUS
  Filled 2020-02-09: qty 40

## 2020-02-09 MED ORDER — ORAL CARE MOUTH RINSE
15.0000 mL | OROMUCOSAL | Status: DC
  Administered 2020-02-09 (×4): 15 mL via OROMUCOSAL

## 2020-02-09 MED ORDER — DEXTROSE-NACL 5-0.45 % IV SOLN: INTRAVENOUS | Status: DC

## 2020-02-09 MED ORDER — ENOXAPARIN SODIUM 40 MG/0.4ML ~~LOC~~ SOLN
40.0000 mg | Freq: Every day | SUBCUTANEOUS | Status: DC
  Administered 2020-02-09: 40 mg via SUBCUTANEOUS
  Filled 2020-02-09: qty 0.4

## 2020-02-09 MED ORDER — POLYETHYLENE GLYCOL 3350 17 G PO PACK: 17.0000 g | PACK | Freq: Every day | ORAL | Status: DC | PRN

## 2020-02-09 MED ORDER — CHLORHEXIDINE GLUCONATE 0.12% ORAL RINSE (MEDLINE KIT)
15.0000 mL | Freq: Two times a day (BID) | OROMUCOSAL | Status: DC
  Administered 2020-02-09: 15 mL via OROMUCOSAL

## 2020-02-09 MED ORDER — SORBITOL 70 % SOLN: 30.0000 mL | Freq: Every day | Status: DC

## 2020-02-09 MED ORDER — CHLORHEXIDINE GLUCONATE CLOTH 2 % EX PADS: 6.0000 | MEDICATED_PAD | Freq: Every day | CUTANEOUS | Status: DC

## 2020-02-09 NOTE — Progress Notes (Signed)
[facility]Link Physician-Brief Progress Note Patient Name: Stuart Woods DOB: [birth year] MRN: [id number]   Date of Service  02/09/2020  HPI/Events of Note  Notified of chronic PICC with good blood return and adequate placement (since Jan 2021).  PT is admitted for bowel obstruction.   RN also requesting to change venous blood gas instead of arterial blood gas as the patient is a difficult stick.   eICU Interventions  Ok to continue using PICC line.  Venous blood gas ordered.     Intervention Category Minor Interventions: Other:  Larinda Buttery 02/09/2020, 4:08 AM

## 2020-02-09 NOTE — H&P (Signed)
NAME:  Stuart Woods, MRN:  [id number], DOB:  06-25-1966, LOS: 0 ADMISSION DATE:  02/08/2020, CONSULTATION DATE:  02/09/20 REFERRING MD:  EDP, CHIEF COMPLAINT:  SBO   Brief History   54 year old male with a history of ALS trach/vent dependent who presents from [facility] nursing home with concerns for partial bowel obstruction and for small right pneumothorax.  Digital disimpaction done with subsequent BM.  Hemodynamically stable, PCCM consulted for admission secondary to vent dependence.  History of present illness   Stuart Woods is a 54 y.o. M with PMH of ALS diagnosed ~97yrs ago and chronic sacral decubitus ulcers who resides at [facility] nursing home and is trach and vent dependent.  Pt apparently had not had a BM since 6/3 so CT abd/pelvis obtained showing large stool burden and partial SBO. CT chest obtained with showed bilateral basilar effusions or infiltrates and 10% R pneumothorax.  No noted respiratory distress or hypoxia in [facility] records.   In the ED, patient was hemodynamically stable and afebrile.  Digital dis-impaction done with subsequent loose stools.  CXR in the ED without pneumothorax, showed mild R basilar atelectasis/infiltrate.  Covid-19 was negative, WBC 12k, lactic acid normal.  PCCM consulted for admission  Past Medical History   has a past medical history of ALS (amyotrophic lateral sclerosis) (HCC).   Significant Hospital Events   6/7 Admit to PCCM  Consults:     Procedures:    Significant Diagnostic Tests:  6/7 CXR>>Mild right basilar atelectasis and/or infiltrate.  Micro Data:  6/7 Covid-19>>negative 6/7 Blood cultures x2>>  Antimicrobials:     Interim history/subjective:  Remained stable throughout ED course   Objective   Blood pressure 122/85, pulse 79, temperature 98.2 F (36.8 C), temperature source Oral, resp. rate 17, SpO2 93 %.    Vent Mode: PRVC FiO2 (%):  [40 %] 40 % Set Rate:  [15 bmp] 15 bmp Vt Set:  [500 mL] 500 mL PEEP:  [5 cmH20]  5 cmH20 Plateau Pressure:  [10 cmH20] 10 cmH20  No intake or output data in the 24 hours ending 02/09/20 0033 There were no vitals filed for this visit.  General:  Thin, poorly nourished M, awake and in no distress HEENT: MM pink/moist, trach in place Neuro: awake, tracking with eyes, shakes head to answer questions CV: s1s2 rrr, no m/r/g PULM:  On full vent support, course breath sounds throughout GI: soft, bsx4 active, tender to palpation  Extremities: warm/dry, no edema  Skin: no rashes or lesions   Resolved Hospital Problem list     Assessment & Plan:   Partial SBO Seen on CT at [facility], loose stools after disimpaction, no lactic acidosis, no vomiting -check KUB, hold laxatives -NPO, start D5 1/2 NS -No need for NG tube currently    Possible infiltrates/atelectasis  Afebrile with no lactic acidosis and minimal leukocytosis -hold antibiotics for now, check procal   ALS -Continue riluzole and neurontin -Maintain full vent support  -titrate Vent setting to maintain SpO2 greater than or equal to 90%. -HOB elevated 30 degrees. -Plateau pressures less than 30 cm H20.  -Follow chest x-ray, ABG prn.   -Bronchial hygiene and RT/bronchodilator protocol.        Best practice:  Diet: NPO Pain/Anxiety/Delirium protocol (if indicated): fentanyl prn VAP protocol (if indicated): HOB 30 degrees, suction prn DVT prophylaxis: lovenox GI prophylaxis: protonix Glucose control: SSI Mobility: bed rest Code Status: full code Family Communication: unable to reach patient's wife Disposition: ICU  Labs   CBC: Recent Labs  Lab 02/08/20 2309  WBC 12.0*  NEUTROABS 10.1*  HGB 11.1*  HCT 35.9*  MCV 90.4  PLT 463*    Basic Metabolic Panel: Recent Labs  Lab 02/08/20 2309  NA 136  K 4.1  CL 101  CO2 20*  GLUCOSE 74  BUN 26*  CREATININE 0.33*  CALCIUM 8.9   GFR: CrCl cannot be calculated (Unknown ideal weight.). Recent Labs  Lab 02/08/20 2309 02/08/20 2320   WBC 12.0*  --   LATICACIDVEN  --  1.0    Liver Function Tests: Recent Labs  Lab 02/08/20 2309  AST 26  ALT 29  ALKPHOS 101  BILITOT 1.0  PROT 7.9  ALBUMIN 2.8*   No results for input(s): LIPASE, AMYLASE in the last 168 hours. No results for input(s): AMMONIA in the last 168 hours.  ABG    Component Value Date/Time   PHART 7.572 (H) 09/22/2019 0823   PCO2ART 38.8 09/22/2019 0823   PO2ART 114.0 (H) 09/22/2019 0823   HCO3 35.8 (H) 09/22/2019 0823   TCO2 37 (H) 09/22/2019 0823   O2SAT 99.0 09/22/2019 0823     Coagulation Profile: No results for input(s): INR, PROTIME in the last 168 hours.  Cardiac Enzymes: No results for input(s): CKTOTAL, CKMB, CKMBINDEX, TROPONINI in the last 168 hours.  HbA1C: No results found for: HGBA1C  CBG: No results for input(s): GLUCAP in the last 168 hours.  Review of Systems:   Unable to obtain secondary to mental status  Past Medical History  He,  has a past medical history of ALS (amyotrophic lateral sclerosis) ([place]).   Surgical History    Past Surgical History:  Procedure Laterality Date   GASTROSTOMY TUBE PLACEMENT     TRACHEOSTOMY       Social History      Family History   His family history is not on file.   Allergies No Known Allergies   Home Medications  Prior to Admission medications   Medication Sig Start Date End Date Taking? Authorizing Provider  acetaminophen (TYLENOL) 500 MG tablet Place 500 mg into feeding tube every 12 (twelve) hours.    Yes [provider]  Amino Acids-Protein Hydrolys (FEEDING SUPPLEMENT, PRO-STAT SUGAR FREE 64,) LIQD Place 30 mLs into feeding tube daily.   Yes [provider]  Artificial Saliva (BIOTENE ORALBALANCE DRY MOUTH) GEL Use as directed 1 application in the mouth or throat 3 (three) times daily.   Yes [provider]  ascorbic acid (VITAMIN C) 500 MG tablet Place 500 mg into feeding tube daily.   Yes [provider]  bisacodyl (DULCOLAX)  10 MG suppository Place 10 mg rectally daily as needed for moderate constipation.   Yes [provider]  chlorhexidine (PERIDEX) 0.12 % solution Use as directed 15 mLs in the mouth or throat See admin instructions. Swab mouth with chlorhexidine every shift   Yes [provider]  diatrizoate meglumine-sodium (GASTROGRAFIN) 66-10 % solution Take 30 mLs by mouth once.   Yes [provider]  diphenhydrAMINE (BANOPHEN) 25 MG tablet 25 mg every 8 (eight) hours as needed for itching (PER TUBE).    Yes [provider]  docusate (COLACE) 50 MG/5ML liquid Place 100 mg into feeding tube every 8 (eight) hours.    Yes [provider]  enoxaparin (LOVENOX) 30 MG/0.3ML injection Inject 30 mg into the skin daily.   Yes [provider]  folic acid (FOLVITE) 1 MG tablet Place 2 mg into feeding tube daily.   Yes [provider]  gabapentin (NEURONTIN) 300 MG capsule Place 300 mg into feeding tube every 8 (eight) hours.    Yes [provider]  hydrocortisone cream 1 % Apply 1 application topically every 8 (eight) hours as needed for itching.   Yes [provider]  ipratropium-albuterol (DUONEB) 0.5-2.5 (3) MG/3ML SOLN Take 3 mLs by nebulization every 4 (four) hours as needed (shortness of breath/wheezing).    Yes [provider]  Melatonin 3 MG TABS Give 6 mg by tube at bedtime.    Yes [provider]  midodrine (PROAMATINE) 5 MG tablet Place 5 mg into feeding tube every 8 (eight) hours.   Yes [provider]  Multiple Vitamins-Minerals (MULTIVITAMIN WITH MINERALS) tablet Place 1 tablet into feeding tube daily.    Yes [provider]  nutrition supplement, JUVEN, (JUVEN) PACK Place 1 packet into feeding tube 2 (two) times daily between meals.   Yes [provider]  Nutritional Supplements (COMPLEAT PEPTIDE 1.5) LIQD Place 1,300 mLs into feeding tube See admin instructions. Give at 55 ml/hr  continuous for daily total of 1300 ml   Yes [provider]  nystatin (MYCOSTATIN) 100000 UNIT/ML suspension Use as directed 5 mLs in the mouth or throat See admin instructions. Use foam sponge to apply to  Mouth/throat every 8 hours starting 01/19/2020 - suction mouth to prevent aspiration   Yes [provider]  ondansetron (ZOFRAN) 4 MG tablet Place 4 mg into feeding tube every 6 (six) hours as needed for nausea or vomiting.   Yes [provider]  oxyCODONE (OXY IR/ROXICODONE) 5 MG immediate release tablet Place 5 mg into feeding tube See admin instructions. Give one tablet (5 mg) per tube twice daily, also administer one tablet (5 mg) 30 minutes prior to wound care   Yes [provider]  pantoprazole sodium (PROTONIX) 40 mg/20 mL PACK Place 40 mg into feeding tube daily.   Yes [provider]  polyethylene glycol (MIRALAX / GLYCOLAX) 17 g packet Place 17 g into feeding tube daily as needed for moderate constipation.   Yes [provider]  Probiotic Product (PROBIOTIC ADVANCED PO) Place 1 capsule into feeding tube every 12 (twelve) hours. Ultimate Flora 30 billion    Yes [provider]  riluzole (RILUTEK) 50 MG tablet Place 50 mg into feeding tube every 12 (twelve) hours.   Yes [provider]  scopolamine (TRANSDERM-SCOP, 1.5 MG,) 1 MG/3DAYS Place 1 patch onto the skin every 3 (three) days.   Yes [provider]  sennosides (SENOKOT) 8.8 MG/5ML syrup Place 5 mLs into feeding tube every 12 (twelve) hours.    Yes [provider]  sertraline (ZOLOFT) 100 MG tablet Place 100 mg into feeding tube daily.   Yes [provider]  simethicone (MYLICON) 80 MG chewable tablet Place 160 mg into feeding tube every 8 (eight) hours.    Yes [provider]  sodium chloride flush 0.9 % SOLN injection Inject 10 mLs into the vein See admin instructions. Per shift   Yes [provider]  traZODone (DESYREL)  50 MG tablet Place 50 mg into feeding tube at bedtime.   Yes [provider]  Water For Irrigation, Sterile (FREE WATER) SOLN Place 200 mLs into feeding tube every 6 (six) hours.   Yes [provider]  zinc sulfate 220 (50 Zn) MG capsule Place 220 mg into feeding tube at bedtime.    Yes [provider]     Critical care time: 28  minutes    CRITICAL CARE Performed by: Darcella Gasman Yuri Fana   Total critical care time: 52 minutes  Critical care time was exclusive of separately billable procedures and treating other patients.  Critical care was necessary to treat or prevent imminent or life-threatening deterioration.  Critical care was time spent personally by me on the following activities: development of treatment plan with patient and/or surrogate as well as nursing, discussions with consultants, evaluation of patient's response to treatment, examination of patient, obtaining history from patient or surrogate, ordering and performing treatments and interventions, ordering and review of laboratory studies, ordering and review of radiographic studies, pulse oximetry and re-evaluation of patient's condition.  Darcella Gasman Avelynn Sellin, PA-C

## 2020-02-09 NOTE — Progress Notes (Signed)
Attempted to reach wife Irving Burton at [phone] and [phone] for update and to let her know he will be transferred back to [facility] today.  Unable to reach by phone, no answer and did not go to voicemail.     Posey Boyer, MSN, AGACNP-BC [facility] 02/09/2020, 11:57 AM  See Loretha Stapler for personal pager PCCM on call pager [phone]

## 2020-02-09 NOTE — Progress Notes (Signed)
Initial Nutrition Assessment  DOCUMENTATION CODES:   Non-severe (moderate) malnutrition in context of chronic illness  INTERVENTION:   Tube feeding via PEG: - Osmolite 1.2 @ 45 ml/hr (1080 ml/day) - Pro-stat 30 ml daily  Tube feeding regimen provides 1396 kcal, 75 grams of protein, and 886 ml of H2O.  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  GOAL:   Patient will meet greater than or equal to 90% of their needs  MONITOR:   Vent status, Labs, Weight trends, TF tolerance, Skin, I & O's  REASON FOR ASSESSMENT:   Ventilator, Consult Enteral/tube feeding initiation and management  ASSESSMENT:   54 year old male who presented on 6/06 from [facility] with partial SBO. PMH of ALS, chronic sacral decubitus ulcers, trach and vent dependent. Manual disimpaction done in the ED.   RD consulted for TF initiation and management. PEG tube in place. Pt may d/c back to [facility] today. Discussed pt with RN and during ICU rounds.  Patient is on chronic ventilator support via trach MV: 8.4 L/min Temp (24hrs), Avg:98.3 F (36.8 C), Min:98.2 F (36.8 C), Max:98.5 F (36.9 C) BP (cuff): 126/78 MAP (cuff): 91  Drips: D5 in 1/2NS: 75 ml/hr  Medications reviewed and include: SSI q 4 hours, IV Reglan 10 mg q 6 hours, protonix, miralax, sorbitol  Labs reviewed. CBG's: 81-113  NUTRITION - FOCUSED PHYSICAL EXAM:    Most Recent Value  Orbital Region  Mild depletion  Upper Arm Region  Moderate depletion  Thoracic and Lumbar Region   Moderate depletion  Buccal Region  Moderate depletion  Temple Region  Moderate depletion  Clavicle Bone Region  Severe depletion  Clavicle and Acromion Bone Region  Severe depletion  Scapular Bone Region  Unable to assess  Dorsal Hand  Moderate depletion  Patellar Region  Severe depletion  Anterior Thigh Region  Severe depletion  Posterior Calf Region  Severe depletion  Edema (RD Assessment)  None  Hair  Reviewed  Eyes  Reviewed  Mouth  Reviewed  Skin  Reviewed  Nails  Reviewed       Diet Order:   Diet Order            Diet NPO time specified  Diet effective now              EDUCATION NEEDS:   No education needs have been identified at this time  Skin:  Skin Assessment: Skin Integrity Issues: DTI: right heel Stage IV: bilateral coccyx  Last BM:  02/09/20  Height:   Ht Readings from Last 1 Encounters:  02/09/20 5\' 11"  (1.803 m)    Weight:   Wt Readings from Last 1 Encounters:  09/23/19 49.8 kg    Ideal Body Weight:  78.2 kg  BMI:  Body mass index is 15.31 kg/m.  Estimated Nutritional Needs:   Kcal:  1379  Protein:  75-90 grams  Fluid:  >/= 1.4 L    09/25/19, MS, RD, LDN Inpatient Clinical Dietitian Pager: [phone] Weekend/After Hours: [phone]

## 2020-02-09 NOTE — Consult Note (Signed)
WOC Nurse Consult Note: Patient receiving care in [facility] 2M10.  Assisted with turning for exam by primary RN. Reason for Consult: Stage 4 PIs, DTI right heel Wound type: Bilateral stage 4 PIs to ischium, no wounds on heels Pressure Injury POA: Yes Measurement: Right ischial wound measures 2.2 cm x 2 cm x 2.2 cm with 2.2 cm of undermining from 9 - 3 o'clock.  Wound bed is pink, no palpable or observable bone. Left ischial wound measures 2.2 cm x 1.4 cm x 2.0 cm with observable bone in wound bed.  There is undermining circumferentially. Both wound edges have epibole.  Wound bed: pink, no granulation tissue Drainage (amount, consistency, odor) serosanginous on existing packing. Periwound: intact Dressing procedure/placement/frequency: Place Iodoform packing [place] [phone]) into the left and right ischial wounds. Be sure to place into the undermined area that cannot be seen.  Leave a tail hanging out for easily removal.  Cover with a size appropriate foam.  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  Antifungal powder is needed in these areas.   Monitor the wound area(s) for worsening of condition such as: Signs/symptoms of infection,  Increase in size,  Development of or worsening of odor, Development of pain, or increased pain at the affected locations.  Notify the medical team if any of these develop.  Thank you for the consult.  Discussed plan of care with the nurse.  WOC nurse will not follow at this time.  Please re-consult the WOC team if needed.  Helmut Muster, RN, MSN, CWOCN, CNS-BC, pager [phone]

## 2020-02-09 NOTE — ED Notes (Signed)
Pt incontinent of loose stool, pt cleaned

## 2020-02-09 NOTE — H&P (Signed)
NAME:  Stuart Woods, MRN:  [id number], DOB:  1966/02/21, LOS: 0 ADMISSION DATE:  02/08/2020, CONSULTATION DATE:  02/09/20 REFERRING MD:  EDP, CHIEF COMPLAINT:  SBO   Brief History   54 year old male with a history of ALS trach/vent dependent who presents from [facility] nursing home with concerns for partial bowel obstruction and for small right pneumothorax.  Digital disimpaction done with subsequent BM.  Hemodynamically stable, PCCM consulted for admission secondary to vent dependence.  History of present illness   Stuart Woods is a 54 y.o. M with PMH of ALS diagnosed ~37yrs ago and chronic sacral decubitus ulcers who resides at [facility] nursing home and is trach and vent dependent.  Pt apparently had not had a BM since 6/3 so CT abd/pelvis obtained showing large stool burden and partial SBO. CT chest obtained with showed bilateral basilar effusions or infiltrates and 10% R pneumothorax.  No noted respiratory distress or hypoxia in [facility] records.   In the ED, patient was hemodynamically stable and afebrile.  Digital dis-impaction done with subsequent loose stools.  CXR in the ED without pneumothorax, showed mild R basilar atelectasis/infiltrate.  Covid-19 was negative, WBC 12k, lactic acid normal.  PCCM consulted for admission  Past Medical History   has a past medical history of ALS (amyotrophic lateral sclerosis) (HCC).   Significant Hospital Events   6/7 Admit to PCCM  Consults:     Procedures:    Significant Diagnostic Tests:  6/7 CXR>>Mild right basilar atelectasis and/or infiltrate.  Micro Data:  6/7 Covid-19>>negative 6/7 Blood cultures x2>>   Antimicrobials:     Interim history/subjective:  NAEON. Not in any pain.    Objective   Blood pressure 118/76, pulse 74, temperature 98.5 F (36.9 C), temperature source Oral, resp. rate 16, height 5\' 11"  (1.803 m), SpO2 99 %.    Vent Mode: PRVC FiO2 (%):  [30 %-40 %] 30 % Set Rate:  [15 bmp] 15 bmp Vt Set:  [500 mL] 500  mL PEEP:  [5 cmH20] 5 cmH20 Pressure Support:  [12 cmH20] 12 cmH20 Plateau Pressure:  [10 cmH20-18 cmH20] 17 cmH20   Intake/Output Summary (Last 24 hours) at 02/09/2020 04/10/2020 Last data filed at 02/09/2020 0600 Gross per 24 hour  Intake 294.66 ml  Output 475 ml  Net -180.34 ml   There were no vitals filed for this visit.  General:  Thin, poorly nourished M, awake and in no distress HEENT: MM pink/moist, trach in place, PERRLA Neuro: awake, tracking with eyes, shakes head to answer questions CV: s1s2 rrr, no m/r/g, upper extremity pulses 2+, lower extremity pulses 1+ PULM:  On full vent support, course breath sounds throughout GI: soft, bsx4 active, tender to palpation, distended   GU: Condom cath Extremities: cold/dry, no edema  Skin: no rashes or lesions   Resolved Hospital Problem list     Assessment & Plan:   Possible partial SBO Seen on CT at [facility], loose stools after disimpaction, no lactic acidosis, no vomiting. KUB with no abnormal stool retention. CT reviewed, large rectal ball noted.  -NPO, start D5 1/2 NS -No need for NG tube currently  -Replete K as needed  -Serial abdominal exams  -KUB PRN -Daily bowel regimen with miralax, sorbital, docusate suppository     Possible infiltrates/atelectasis  Afebrile with no lactic acidosis but leukocytosis. Procalcitonin within normal limits. History of pseudomonas tracheal cultures.  -will continue to monitor O2 levels, if increasing vent needs, will obtain trach culture  Leukocytosis: afebrile, HDS, in setting of recent  manual disimpaction  -UA -repeat lactate level -continue to monitor for fever, instability, worsening leukocystosis  ALS -Continue riluzole and neurontin -Maintain full vent support  -titrate Vent setting to maintain SpO2 greater than or equal to 90%. -HOB elevated 30 degrees. -Plateau pressures less than 30 cm H20.  -Follow chest x-ray, ABG prn.   -Bronchial hygiene and RT/bronchodilator  protocol.  Stage IV sacral decubitus ulcer -wound care consult   Best practice:  Diet: NPO Pain/Anxiety/Delirium protocol (if indicated): fentanyl prn VAP protocol (if indicated): HOB 30 degrees, suction prn DVT prophylaxis: lovenox GI prophylaxis: protonix Glucose control: SSI Mobility: bed rest Code Status: full code Family Communication: will attempt to reach patient's wife Disposition: ICU  Labs    CBC: increasing leukocytosis VBG: resp alkalosis CMP: unremarkable   Review of Systems:   Unable to obtain secondary to mental status  Past Medical History  He,  has a past medical history of ALS (amyotrophic lateral sclerosis) ([place]).   Surgical History    Past Surgical History:  Procedure Laterality Date  . GASTROSTOMY TUBE PLACEMENT    . TRACHEOSTOMY       Social History      Family History   His family history is not on file.   Allergies No Known Allergies   Home Medications : See medications tab   Critical care time: 33 minutes    CRITICAL CARE  Marzetta Merino Dekisha Mesmer, Medical Student

## 2020-02-09 NOTE — Discharge Summary (Signed)
Physician Discharge Summary         Patient ID: Stuart Woods MRN: [id number] DOB/AGE: Nov 02, 1965 54 y.o.  Admit date: 02/08/2020 Discharge date: 02/09/2020  Discharge Diagnoses:    Partial SBO related to stool impaction Chronic respiratory failure s/p tracheostomy  Right basilar infiltrate/ atelectasis  Right pneumothorax, trivial  ALS Chronic stage IV sacral decubitus ulcer  Discharge summary    Stuart Woods is a 54 year old male with a past medical history of ALS with trach/vent dependence who presented from [facility] nursing home due to concerns for a partial bowel obstruction. His hospital course by problem is summarized below.   Concern for small bowel obstruction, likely rectal stool impaction:  Patient under went CT scan at [facility] after staffing realized he had not had a bowel movement since 6/3. CT showed large stool burden and partial SBO at which point he was transferred to [facility]. He was manually disimpacted in [facility] ED with subsequent loose stools. An abdominal x-ray the following morning did not show any abnormal stool retention. Upon second review of the CT scan, it was felt that the bowel dilation was caused by a large rectal stool ball and there was low concern for true small bowel obstruction. Patient was started on more aggressive bowel regimen of daily Miramax, sorbitol and docusate suppository.   R lung pneumothorax CT chest from [facility] on 6/6 showed a small (10%) Right pneumothorax. Repeat chest x ray on 6/6 at [facility] showed resolution of pneumothorax.   Possible lung infiltrates/atelectasis Findings noted from chest x ray on 6/7. Given patient's hemodynamic stability, lack of fever and consistent ventilation requirements, determined to be most likely due to atelectasis.   ALS Patient was continued on riluzole and gabapentin. Ventilator settings were maintained.  Stage IV sacral decubitus ulcer Wound care was provided for patient after nursing  staff found bilateral stage IV sacral decubitus ulcers. 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 Patient was started on antifungal powder for these areas.   Discharge Plan by Active Problems    Possible partial SBO Seen on CT at [facility], loose stools after disimpaction, no lactic acidosis, no vomiting. KUB with no abnormal stool retention. CT reviewed, large rectal ball noted.  - continue daily bowel regimen with miralax, sorbital, docusate suppository    - resume TFs - serial abdominal exams   Possible infiltrates/atelectasis  Afebrile with no lactic acidosis but leukocytosis. Procalcitonin within normal limits. History of pseudomonas tracheal cultures.  - clinically monitor for now.  If increasing vent needs or fever, send trach culture  Right pneumothorax  - trivial, trend CXR  Leukocytosis: afebrile, HDS, in setting of recent manual disimpaction +/- right basilar atelectasis  - f/u CBC, trend fever curve   ALS -Continue riluzole and neurontin -Maintain full vent support  -titrate Vent setting to maintain SpO2 greater than or equal to 90%. (has remained on PRVC 15/ 500/ 5/ 30% here) -HOB elevated 30 degrees. -Plateau pressures less than 30 cm H20.   -Bronchial hygiene and RT/bronchodilator protocol.  Stage IV sacral decubitus ulcer -per wound care and max nutritional support  Significant Hospital tests/ studies  6/6 CXR >> 1. Tracheostomy tube with its distal tip at the level of the clavicles. 2. Mild right basilar atelectasis and/or infiltrate.  6/7 abd 1view XR >> No evidence of obstruction.  No abnormal stool retention.  Procedures   pta RUE SL PICC >>  pta 6 portex trach >>  Culture data/antimicrobials   6/7 Covid-19>>negative 6/7 Blood cultures x2>>   No abx    Consults  n/a    Discharge Exam: BP 126/78    Pulse 74    Temp 97.8 F (36.6 C) (Oral)    Resp 18    Ht 5'  11" (1.803 m)    SpO2 97%    BMI 15.31 kg/m   General:  Chronically ill thin male in NAD  HEENT: MM pink/moist, midline portex 6 trach, wearing glasses Neuro: Awake, nods/ mouths words, flaccid in lower extremities CV: rr, no murmur PULM:  Non labored on full MV support, scattered rhonchi GI: soft, bs hyper, mildly tender throughout, ND, peg upper abd, condom cath Extremities: cool/dry, no LE edema  Skin: no rashes, posterior sacral wound not visualized  Vent Mode: PRVC FiO2 (%):  [30 %-40 %] 30 % Set Rate:  [15 bmp] 15 bmp Vt Set:  [500 mL] 500 mL PEEP:  [5 cmH20] 5 cmH20 Pressure Support:  [12 cmH20] 12 cmH20 Plateau Pressure:  [10 cmH20-18 cmH20] 18 cmH20   Labs at discharge   Lab Results  Component Value Date   CREATININE <0.30 (L) 02/09/2020   BUN 20 02/09/2020   NA 135 02/09/2020   K 3.8 02/09/2020   CL 100 02/09/2020   CO2 20 (L) 02/09/2020   Lab Results  Component Value Date   WBC 18.0 (H) 02/09/2020   HGB 10.9 (L) 02/09/2020   HCT 32.0 (L) 02/09/2020   MCV 89.4 02/09/2020   PLT 453 (H) 02/09/2020   Lab Results  Component Value Date   ALT 29 02/08/2020   AST 26 02/08/2020   ALKPHOS 101 02/08/2020   BILITOT 1.0 02/08/2020   Lab Results  Component Value Date   INR 1.1 10/16/2019   INR 1.1 09/10/2019    Current radiological studies    DG Chest Portable 1 View  Result Date: 02/08/2020 CLINICAL DATA:  Evaluate tracheostomy tube positioning. EXAM: PORTABLE CHEST 1 VIEW COMPARISON:  September 21, 2019 FINDINGS: A tracheostomy tube is seen with its distal tip noted at the level of the clavicles. There is stable right-sided PICC line positioning. Mild atelectasis and/or infiltrate is seen within the right lung base. There is no evidence of a pleural effusion or pneumothorax. The heart size and mediastinal contours are within normal limits. The visualized skeletal structures are unremarkable. IMPRESSION: 1. Tracheostomy tube with its distal tip at the level of the  clavicles. 2. Mild right basilar atelectasis and/or infiltrate. Electronically Signed   By: Aram Candela M.D.   On: 02/08/2020 21:21   DG Abd Portable 1V  Result Date: 02/09/2020 CLINICAL DATA:  Constipation EXAM: PORTABLE ABDOMEN - 1 VIEW COMPARISON:  None. FINDINGS: No abnormal stool retention. Some enteric contrast is seen over right-sided bowel loops. No obstructive pattern. Contrast also opacifies the urinary bladder. Percutaneous gastrostomy tube in expected position. IMPRESSION: No evidence of obstruction.  No abnormal stool retention. Electronically Signed   By: Marnee Spring M.D.   On: 02/09/2020 06:39    Disposition:    Discharge disposition: 03-Skilled Nursing Facility       Discharge Instructions    Discharge wound care:   Complete by: As directed    Per [facility] wound care      Allergies as of 02/09/2020   No Known Allergies     Medication List    STOP taking these medications   diatrizoate meglumine-sodium 66-10 % solution Commonly known  as: GASTROGRAFIN   sodium chloride flush 0.9 % Soln injection     TAKE these medications   acetaminophen 500 MG tablet Commonly known as: TYLENOL Place 500 mg into feeding tube every 12 (twelve) hours.   ascorbic acid 500 MG tablet Commonly known as: VITAMIN C Place 500 mg into feeding tube daily.   Banophen 25 MG tablet Generic drug: diphenhydrAMINE 25 mg every 8 (eight) hours as needed for itching (PER TUBE).   Biotene OralBalance Dry Mouth Gel Use as directed 1 application in the mouth or throat 3 (three) times daily.   bisacodyl 10 MG suppository Commonly known as: DULCOLAX Place 10 mg rectally daily as needed for moderate constipation.   chlorhexidine 0.12 % solution Commonly known as: PERIDEX Use as directed 15 mLs in the mouth or throat See admin instructions. Swab mouth with chlorhexidine every shift   Compleat Peptide 1.5 Liqd Place 1,300 mLs into feeding tube See admin instructions. Give at 55  ml/hr continuous for daily total of 1300 ml   nutrition supplement (JUVEN) Pack Place 1 packet into feeding tube 2 (two) times daily between meals.   docusate 50 MG/5ML liquid Commonly known as: COLACE Place 10 mLs (100 mg total) into feeding tube 2 (two) times daily. What changed: when to take this   enoxaparin 30 MG/0.3ML injection Commonly known as: LOVENOX Inject 30 mg into the skin daily.   feeding supplement (PRO-STAT SUGAR FREE 64) Liqd Place 30 mLs into feeding tube daily.   folic acid 1 MG tablet Commonly known as: FOLVITE Place 2 mg into feeding tube daily.   free water Soln Place 200 mLs into feeding tube every 6 (six) hours.   gabapentin 300 MG capsule Commonly known as: NEURONTIN Place 300 mg into feeding tube every 8 (eight) hours.   hydrocortisone cream 1 % Apply 1 application topically every 8 (eight) hours as needed for itching.   ipratropium-albuterol 0.5-2.5 (3) MG/3ML Soln Commonly known as: DUONEB Take 3 mLs by nebulization every 4 (four) hours as needed (shortness of breath/wheezing).   melatonin 3 MG Tabs tablet Give 6 mg by tube at bedtime.   midodrine 5 MG tablet Commonly known as: PROAMATINE Place 5 mg into feeding tube every 8 (eight) hours.   multivitamin with minerals tablet Place 1 tablet into feeding tube daily.   nystatin 100000 UNIT/ML suspension Commonly known as: MYCOSTATIN Use as directed 5 mLs in the mouth or throat See admin instructions. Use foam sponge to apply to  Mouth/throat every 8 hours starting 01/19/2020 - suction mouth to prevent aspiration   ondansetron 4 MG tablet Commonly known as: ZOFRAN Place 4 mg into feeding tube every 6 (six) hours as needed for nausea or vomiting.   oxyCODONE 5 MG immediate release tablet Commonly known as: Oxy IR/ROXICODONE Place 5 mg into feeding tube See admin instructions. Give one tablet (5 mg) per tube twice daily, also administer one tablet (5 mg) 30 minutes prior to wound care    pantoprazole sodium 40 mg/20 mL Pack Commonly known as: PROTONIX Place 40 mg into feeding tube daily.   polyethylene glycol 17 g packet Commonly known as: MIRALAX / GLYCOLAX Take 17 g by mouth daily. Start taking on: February 10, 2020 What changed:   how to take this  when to take this  reasons to take this   PROBIOTIC ADVANCED PO Place 1 capsule into feeding tube every 12 (twelve) hours. Ultimate Flora 30 billion   riluzole 50 MG tablet Commonly known as:  RILUTEK Place 50 mg into feeding tube every 12 (twelve) hours.   sennosides 8.8 MG/5ML syrup Commonly known as: SENOKOT Place 5 mLs into feeding tube every 12 (twelve) hours.   sertraline 100 MG tablet Commonly known as: ZOLOFT Place 100 mg into feeding tube daily.   simethicone 80 MG chewable tablet Commonly known as: MYLICON Place 160 mg into feeding tube every 8 (eight) hours.   sorbitol 70 % Soln Take 30 mLs by mouth daily. Start taking on: February 10, 2020   Transderm-Scop (1.5 MG) 1 MG/3DAYS Generic drug: scopolamine Place 1 patch onto the skin every 3 (three) days.   traZODone 50 MG tablet Commonly known as: DESYREL Place 50 mg into feeding tube at bedtime.   zinc sulfate 220 (50 Zn) MG capsule Place 220 mg into feeding tube at bedtime.            Discharge Care Instructions  (From admission, onward)         Start     Ordered   02/09/20 0000  Discharge wound care:    Comments: Per [facility] wound care   02/09/20 1204           Follow-up appointment   N/a   Discharge Condition:    stable  Physician Statement:   The Patient was personally examined, the discharge assessment and plan has been personally reviewed and I agree with ACNP Winna Golla's assessment and plan. 35  minutes of time have been dedicated to discharge assessment, planning and discharge instructions.   Signed:  Posey Boyer, MSN, AGACNP-BC [facility] 02/09/2020, 12:10 PM  See Amion for personal  pager PCCM on call pager [phone]

## 2020-02-09 NOTE — Care Management CC44 (Signed)
Condition Code 44 Documentation Completed  Patient Details  Name: Anish Vana MRN: [id number] Date of Birth: 1966/06/26   Condition Code 44 given:  Yes Patient signature on Condition Code 44 notice:  Yes Documentation of 2 MD's agreement:  Yes Code 44 added to claim:  Yes    Epifanio Lesches, RN 02/09/2020, 1:03 PM

## 2020-02-09 NOTE — Progress Notes (Addendum)
11:50am: Patient will go to room 304A at [facility]. Patient will be transported via [facility]. The accepting MD is Dr. Leonia Reader. The number to call for report is [phone].  CSW informed Sunny Schlein, Charity fundraiser of information.  11:25am: CSW spoke with Prem at [facility] who states the patient can return. CSW faxed negative COVID results to facility for review.  CSW will arrange for transportation back to [facility] via [facility] once room assignment information is available.  Edwin Dada, MSW, LCSW-A Transitions of Care  Clinical Social Worker  [facility] Emergency Departments  Medical ICU [phone]

## 2020-02-09 NOTE — Care Management Obs Status (Signed)
MEDICARE OBSERVATION STATUS NOTIFICATION   Patient Details  Name: Stuart Woods MRN: [id number] Date of Birth: 1966/03/08   Medicare Observation Status Notification Given:  Yes    Epifanio Lesches, RN 02/09/2020, 1:03 PM

## 2020-02-09 NOTE — Hospital Course (Signed)
[name] is a 54 year old male with a past medical history of ALS with trach/vent dependence who presented from [facility] due to concerns for a partial bowel obstruction. His hospital course by problem is summarized below.   Concern for small bowel obstruction, likely rectal stool impaction:  Patient under went CT scan at [facility] after staffing realized he had not had a bowel movement since 6/3. CT showed large stool burden and partial SBO at which point he was transferred to [facility]. He was manually disimpacted in the [facility] ED with subsequent loose stools. An abdominal x-ray the following morning did not show any abnormal stool retention. Upon second review of the CT scan, it was felt that the bowel dilation was caused by a large rectal stool ball and there was low concern for true small bowel obstruction. Patient was started on more aggressive bowel regimen of daily Miramax, sorbitol and docusate suppository.   R lung pneumothorax CT chest from [facility] on 6/6 showed a small (10%) Right pneumothorax. Repeat chest x ray on 6/6 at [facility] showed resolution of pneumothorax.   Possible lung infiltrates/atelectasis Findings noted from chest x ray on 6/7. Given patient's hemodynamic stability, lack of fever and consistent ventilation requirements, determined to be most likely due to atelectasis.   ALS Patient was continued on riluzole and gabapentin. Ventilator settings were maintained.  Stage IV sacral decubitus ulcer Wound care was provided for patient after nursing staff found bilateral stage IV sacral decubitus ulcers. 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 Patient was started on antifungal powder for these areas.

## 2020-02-09 NOTE — Progress Notes (Signed)
Pt belongings sent with him. 1 Silver color band ring, and  1 dark brown pair of glasses.

## 2020-02-10 DIAGNOSIS — G1221 Amyotrophic lateral sclerosis: Secondary | ICD-10-CM

## 2020-02-10 DIAGNOSIS — J939 Pneumothorax, unspecified: Secondary | ICD-10-CM

## 2020-02-10 DIAGNOSIS — Z930 Tracheostomy status: Secondary | ICD-10-CM

## 2020-02-10 DIAGNOSIS — J9621 Acute and chronic respiratory failure with hypoxia: Secondary | ICD-10-CM

## 2020-02-13 LAB — CULTURE, BLOOD (ROUTINE X 2)
Culture: NO GROWTH
Special Requests: ADEQUATE

## 2020-08-16 ENCOUNTER — Inpatient Hospital Stay (HOSPITAL_COMMUNITY)
Admission: EM | Admit: 2020-08-16 | Discharge: 2020-08-24 | DRG: 870 | Disposition: A | Discharge status: Skilled Nursing Facility | Payer: Medicare Other | Source: Skilled Nursing Facility | Attending: Internal Medicine | Admitting: Internal Medicine

## 2020-08-16 ENCOUNTER — Emergency Department (HOSPITAL_COMMUNITY): Payer: Medicare Other

## 2020-08-16 ENCOUNTER — Other Ambulatory Visit: Payer: Self-pay

## 2020-08-16 ENCOUNTER — Encounter (HOSPITAL_COMMUNITY): Payer: Self-pay | Admitting: Student

## 2020-08-16 DIAGNOSIS — Z20822 Contact with and (suspected) exposure to covid-19: Secondary | ICD-10-CM | POA: Diagnosis present

## 2020-08-16 DIAGNOSIS — E876 Hypokalemia: Secondary | ICD-10-CM | POA: Diagnosis present

## 2020-08-16 DIAGNOSIS — Z930 Tracheostomy status: Secondary | ICD-10-CM | POA: Diagnosis not present

## 2020-08-16 DIAGNOSIS — J9601 Acute respiratory failure with hypoxia: Secondary | ICD-10-CM | POA: Diagnosis not present

## 2020-08-16 DIAGNOSIS — A419 Sepsis, unspecified organism: Secondary | ICD-10-CM | POA: Diagnosis present

## 2020-08-16 DIAGNOSIS — N179 Acute kidney failure, unspecified: Secondary | ICD-10-CM | POA: Diagnosis present

## 2020-08-16 DIAGNOSIS — E870 Hyperosmolality and hypernatremia: Secondary | ICD-10-CM | POA: Diagnosis present

## 2020-08-16 DIAGNOSIS — Y95 Nosocomial condition: Secondary | ICD-10-CM | POA: Diagnosis present

## 2020-08-16 DIAGNOSIS — J690 Pneumonitis due to inhalation of food and vomit: Secondary | ICD-10-CM | POA: Diagnosis present

## 2020-08-16 DIAGNOSIS — R6521 Severe sepsis with septic shock: Secondary | ICD-10-CM | POA: Diagnosis present

## 2020-08-16 DIAGNOSIS — J151 Pneumonia due to Pseudomonas: Secondary | ICD-10-CM | POA: Diagnosis present

## 2020-08-16 DIAGNOSIS — M8668 Other chronic osteomyelitis, other site: Secondary | ICD-10-CM | POA: Diagnosis present

## 2020-08-16 DIAGNOSIS — E861 Hypovolemia: Secondary | ICD-10-CM | POA: Diagnosis present

## 2020-08-16 DIAGNOSIS — J9600 Acute respiratory failure, unspecified whether with hypoxia or hypercapnia: Secondary | ICD-10-CM | POA: Diagnosis present

## 2020-08-16 DIAGNOSIS — J189 Pneumonia, unspecified organism: Secondary | ICD-10-CM

## 2020-08-16 DIAGNOSIS — G1221 Amyotrophic lateral sclerosis: Secondary | ICD-10-CM

## 2020-08-16 DIAGNOSIS — G9341 Metabolic encephalopathy: Secondary | ICD-10-CM | POA: Diagnosis present

## 2020-08-16 DIAGNOSIS — R54 Age-related physical debility: Secondary | ICD-10-CM | POA: Diagnosis present

## 2020-08-16 DIAGNOSIS — L89220 Pressure ulcer of left hip, unstageable: Secondary | ICD-10-CM | POA: Diagnosis present

## 2020-08-16 DIAGNOSIS — Z66 Do not resuscitate: Secondary | ICD-10-CM | POA: Diagnosis present

## 2020-08-16 DIAGNOSIS — Z79899 Other long term (current) drug therapy: Secondary | ICD-10-CM

## 2020-08-16 DIAGNOSIS — Z9911 Dependence on respirator [ventilator] status: Secondary | ICD-10-CM

## 2020-08-16 DIAGNOSIS — I9589 Other hypotension: Secondary | ICD-10-CM | POA: Diagnosis present

## 2020-08-16 DIAGNOSIS — Z1624 Resistance to multiple antibiotics: Secondary | ICD-10-CM | POA: Diagnosis present

## 2020-08-16 DIAGNOSIS — L89150 Pressure ulcer of sacral region, unstageable: Secondary | ICD-10-CM | POA: Diagnosis present

## 2020-08-16 DIAGNOSIS — R6520 Severe sepsis without septic shock: Secondary | ICD-10-CM | POA: Diagnosis not present

## 2020-08-16 DIAGNOSIS — A498 Other bacterial infections of unspecified site: Secondary | ICD-10-CM | POA: Diagnosis not present

## 2020-08-16 DIAGNOSIS — A4152 Sepsis due to Pseudomonas: Secondary | ICD-10-CM | POA: Diagnosis not present

## 2020-08-16 DIAGNOSIS — Z7902 Long term (current) use of antithrombotics/antiplatelets: Secondary | ICD-10-CM

## 2020-08-16 DIAGNOSIS — J9621 Acute and chronic respiratory failure with hypoxia: Secondary | ICD-10-CM | POA: Diagnosis present

## 2020-08-16 LAB — CBC WITH DIFFERENTIAL/PLATELET
Abs Immature Granulocytes: 0.16 10*3/uL — ABNORMAL HIGH (ref 0.00–0.07)
Basophils Absolute: 0.1 10*3/uL (ref 0.0–0.1)
Basophils Relative: 0 %
Eosinophils Absolute: 0 10*3/uL (ref 0.0–0.5)
Eosinophils Relative: 0 %
HCT: 36.5 % — ABNORMAL LOW (ref 39.0–52.0)
Hemoglobin: 10.3 g/dL — ABNORMAL LOW (ref 13.0–17.0)
Immature Granulocytes: 1 %
Lymphocytes Relative: 9 %
Lymphs Abs: 1.5 10*3/uL (ref 0.7–4.0)
MCH: 27 pg (ref 26.0–34.0)
MCHC: 28.2 g/dL — ABNORMAL LOW (ref 30.0–36.0)
MCV: 95.5 fL (ref 80.0–100.0)
Monocytes Absolute: 0.9 10*3/uL (ref 0.1–1.0)
Monocytes Relative: 6 %
Neutro Abs: 13.7 10*3/uL — ABNORMAL HIGH (ref 1.7–7.7)
Neutrophils Relative %: 84 %
Platelets: 687 10*3/uL — ABNORMAL HIGH (ref 150–400)
RBC: 3.82 MIL/uL — ABNORMAL LOW (ref 4.22–5.81)
RDW: 15.5 % (ref 11.5–15.5)
WBC: 16.3 10*3/uL — ABNORMAL HIGH (ref 4.0–10.5)
nRBC: 0 % (ref 0.0–0.2)

## 2020-08-16 LAB — URINALYSIS, ROUTINE W REFLEX MICROSCOPIC
Bilirubin Urine: NEGATIVE
Glucose, UA: NEGATIVE mg/dL
Hgb urine dipstick: NEGATIVE
Ketones, ur: NEGATIVE mg/dL
Leukocytes,Ua: NEGATIVE
Nitrite: NEGATIVE
Protein, ur: 30 mg/dL — AB
Specific Gravity, Urine: 1.024 (ref 1.005–1.030)
pH: 5 (ref 5.0–8.0)

## 2020-08-16 LAB — POCT I-STAT 7, (LYTES, BLD GAS, ICA,H+H)
Acid-Base Excess: 14 mmol/L — ABNORMAL HIGH (ref 0.0–2.0)
Acid-Base Excess: 14 mmol/L — ABNORMAL HIGH (ref 0.0–2.0)
Bicarbonate: 40.4 mmol/L — ABNORMAL HIGH (ref 20.0–28.0)
Bicarbonate: 41.4 mmol/L — ABNORMAL HIGH (ref 20.0–28.0)
Calcium, Ion: 1.27 mmol/L (ref 1.15–1.40)
Calcium, Ion: 1.32 mmol/L (ref 1.15–1.40)
HCT: 27 % — ABNORMAL LOW (ref 39.0–52.0)
HCT: 29 % — ABNORMAL LOW (ref 39.0–52.0)
Hemoglobin: 9.2 g/dL — ABNORMAL LOW (ref 13.0–17.0)
Hemoglobin: 9.9 g/dL — ABNORMAL LOW (ref 13.0–17.0)
O2 Saturation: 39 %
O2 Saturation: 96 %
Patient temperature: 37
Patient temperature: 98.6
Potassium: 2.7 mmol/L — CL (ref 3.5–5.1)
Potassium: 3.2 mmol/L — ABNORMAL LOW (ref 3.5–5.1)
Sodium: 152 mmol/L — ABNORMAL HIGH (ref 135–145)
Sodium: 152 mmol/L — ABNORMAL HIGH (ref 135–145)
TCO2: 42 mmol/L — ABNORMAL HIGH (ref 22–32)
TCO2: 43 mmol/L — ABNORMAL HIGH (ref 22–32)
pCO2 arterial: 58.8 mmHg — ABNORMAL HIGH (ref 32.0–48.0)
pCO2 arterial: 69.5 mmHg (ref 32.0–48.0)
pH, Arterial: 7.383 (ref 7.350–7.450)
pH, Arterial: 7.446 (ref 7.350–7.450)
pO2, Arterial: 24 mmHg — CL (ref 83.0–108.0)
pO2, Arterial: 80 mmHg — ABNORMAL LOW (ref 83.0–108.0)

## 2020-08-16 LAB — I-STAT ARTERIAL BLOOD GAS, ED
Acid-Base Excess: 17 mmol/L — ABNORMAL HIGH (ref 0.0–2.0)
Bicarbonate: 41.4 mmol/L — ABNORMAL HIGH (ref 20.0–28.0)
Calcium, Ion: 1.28 mmol/L (ref 1.15–1.40)
HCT: 27 % — ABNORMAL LOW (ref 39.0–52.0)
Hemoglobin: 9.2 g/dL — ABNORMAL LOW (ref 13.0–17.0)
O2 Saturation: 95 %
Potassium: 3.1 mmol/L — ABNORMAL LOW (ref 3.5–5.1)
Sodium: 152 mmol/L — ABNORMAL HIGH (ref 135–145)
TCO2: 43 mmol/L — ABNORMAL HIGH (ref 22–32)
pCO2 arterial: 48.7 mmHg — ABNORMAL HIGH (ref 32.0–48.0)
pH, Arterial: 7.537 — ABNORMAL HIGH (ref 7.350–7.450)
pO2, Arterial: 68 mmHg — ABNORMAL LOW (ref 83.0–108.0)

## 2020-08-16 LAB — COMPREHENSIVE METABOLIC PANEL
ALT: 13 U/L (ref 0–44)
AST: 20 U/L (ref 15–41)
Albumin: 1.6 g/dL — ABNORMAL LOW (ref 3.5–5.0)
Alkaline Phosphatase: 90 U/L (ref 38–126)
Anion gap: 12 (ref 5–15)
BUN: 58 mg/dL — ABNORMAL HIGH (ref 6–20)
CO2: 40 mmol/L — ABNORMAL HIGH (ref 22–32)
Calcium: 10 mg/dL (ref 8.9–10.3)
Chloride: 100 mmol/L (ref 98–111)
Creatinine, Ser: <0.3 mg/dL — ABNORMAL LOW (ref 0.61–1.24)
Glucose, Bld: 171 mg/dL — ABNORMAL HIGH (ref 70–99)
Potassium: 4 mmol/L (ref 3.5–5.1)
Sodium: 152 mmol/L — ABNORMAL HIGH (ref 135–145)
Total Bilirubin: 0.4 mg/dL (ref 0.3–1.2)
Total Protein: 7.2 g/dL (ref 6.5–8.1)

## 2020-08-16 LAB — MRSA PCR SCREENING: MRSA by PCR: NEGATIVE

## 2020-08-16 LAB — RESP PANEL BY RT-PCR (FLU A&B, COVID) ARPGX2
Influenza A by PCR: NEGATIVE
Influenza B by PCR: NEGATIVE
SARS Coronavirus 2 by RT PCR: NEGATIVE

## 2020-08-16 LAB — MAGNESIUM: Magnesium: 2 mg/dL (ref 1.7–2.4)

## 2020-08-16 LAB — PROTIME-INR
INR: 1.3 — ABNORMAL HIGH (ref 0.8–1.2)
Prothrombin Time: 15.7 seconds — ABNORMAL HIGH (ref 11.4–15.2)

## 2020-08-16 LAB — GLUCOSE, CAPILLARY
Glucose-Capillary: 111 mg/dL — ABNORMAL HIGH (ref 70–99)
Glucose-Capillary: 115 mg/dL — ABNORMAL HIGH (ref 70–99)

## 2020-08-16 LAB — LACTIC ACID, PLASMA
Lactic Acid, Venous: 1.6 mmol/L (ref 0.5–1.9)
Lactic Acid, Venous: 3 mmol/L (ref 0.5–1.9)

## 2020-08-16 LAB — PROCALCITONIN: Procalcitonin: 0.97 ng/mL

## 2020-08-16 LAB — APTT: aPTT: 39 seconds — ABNORMAL HIGH (ref 24–36)

## 2020-08-16 MED ORDER — ONDANSETRON HCL 4 MG/2ML IJ SOLN
4.0000 mg | Freq: Four times a day (QID) | INTRAMUSCULAR | Status: DC | PRN
  Administered 2020-08-17 (×2): 4 mg via INTRAVENOUS
  Filled 2020-08-16 (×2): qty 2

## 2020-08-16 MED ORDER — DOCUSATE SODIUM 100 MG PO CAPS: 100.0000 mg | ORAL_CAPSULE | Freq: Two times a day (BID) | ORAL | Status: DC | PRN

## 2020-08-16 MED ORDER — SODIUM CHLORIDE 0.9 % IV SOLN
2.0000 g | Freq: Three times a day (TID) | INTRAVENOUS | Status: DC
  Administered 2020-08-16 – 2020-08-17 (×3): 2 g via INTRAVENOUS
  Filled 2020-08-16 (×3): qty 2

## 2020-08-16 MED ORDER — VANCOMYCIN HCL 500 MG/100ML IV SOLN
500.0000 mg | Freq: Two times a day (BID) | INTRAVENOUS | Status: DC
  Administered 2020-08-17 (×2): 500 mg via INTRAVENOUS
  Filled 2020-08-16 (×2): qty 100

## 2020-08-16 MED ORDER — VANCOMYCIN HCL IN DEXTROSE 1-5 GM/200ML-% IV SOLN
1000.0000 mg | Freq: Once | INTRAVENOUS | Status: AC
  Administered 2020-08-16: 1000 mg via INTRAVENOUS
  Filled 2020-08-16: qty 200

## 2020-08-16 MED ORDER — LACTATED RINGERS IV SOLN: INTRAVENOUS | Status: DC

## 2020-08-16 MED ORDER — SODIUM CHLORIDE 0.9% FLUSH
10.0000 mL | Freq: Two times a day (BID) | INTRAVENOUS | Status: DC
  Administered 2020-08-16 – 2020-08-18 (×4): 10 mL

## 2020-08-16 MED ORDER — SODIUM CHLORIDE 0.9 % IV BOLUS (SEPSIS)
500.0000 mL | Freq: Once | INTRAVENOUS | Status: AC
Start: 2020-08-16 — End: 2020-08-16
  Administered 2020-08-16: 500 mL via INTRAVENOUS

## 2020-08-16 MED ORDER — FAMOTIDINE IN NACL 20-0.9 MG/50ML-% IV SOLN
20.0000 mg | Freq: Two times a day (BID) | INTRAVENOUS | Status: DC
  Administered 2020-08-16: 20 mg via INTRAVENOUS
  Filled 2020-08-16 (×3): qty 50

## 2020-08-16 MED ORDER — SORBITOL 70 % SOLN
30.0000 mL | Freq: Every day | Status: DC
  Filled 2020-08-16: qty 30

## 2020-08-16 MED ORDER — SODIUM CHLORIDE 0.9% FLUSH
10.0000 mL | INTRAVENOUS | Status: DC | PRN
  Administered 2020-08-16: 10 mL

## 2020-08-16 MED ORDER — CHLORHEXIDINE GLUCONATE 0.12 % MT SOLN: 15.0000 mL | OROMUCOSAL | Status: DC

## 2020-08-16 MED ORDER — SODIUM CHLORIDE 0.9% FLUSH: 10.0000 mL | INTRAVENOUS | Status: DC | PRN

## 2020-08-16 MED ORDER — ACETAMINOPHEN 325 MG PO TABS: 650.0000 mg | ORAL_TABLET | ORAL | Status: DC | PRN

## 2020-08-16 MED ORDER — SODIUM CHLORIDE 0.9% FLUSH
10.0000 mL | Freq: Two times a day (BID) | INTRAVENOUS | Status: DC
  Administered 2020-08-16 – 2020-08-22 (×10): 10 mL

## 2020-08-16 MED ORDER — ENOXAPARIN SODIUM 40 MG/0.4ML ~~LOC~~ SOLN
40.0000 mg | SUBCUTANEOUS | Status: DC
  Administered 2020-08-16 – 2020-08-23 (×8): 40 mg via SUBCUTANEOUS
  Filled 2020-08-16 (×8): qty 0.4

## 2020-08-16 MED ORDER — ACETAMINOPHEN 325 MG PO TABS: 650.0000 mg | ORAL_TABLET | Freq: Once | ORAL | Status: DC

## 2020-08-16 MED ORDER — METRONIDAZOLE IN NACL 5-0.79 MG/ML-% IV SOLN
500.0000 mg | Freq: Once | INTRAVENOUS | Status: AC
  Administered 2020-08-16: 500 mg via INTRAVENOUS
  Filled 2020-08-16: qty 100

## 2020-08-16 MED ORDER — RILUZOLE 50 MG PO TABS
50.0000 mg | ORAL_TABLET | Freq: Two times a day (BID) | ORAL | Status: DC
  Filled 2020-08-16 (×3): qty 1

## 2020-08-16 MED ORDER — SODIUM CHLORIDE 0.9 % IV BOLUS (SEPSIS)
1000.0000 mL | Freq: Once | INTRAVENOUS | Status: AC
  Administered 2020-08-16: 1000 mL via INTRAVENOUS

## 2020-08-16 MED ORDER — CHLORHEXIDINE GLUCONATE CLOTH 2 % EX PADS
6.0000 | MEDICATED_PAD | Freq: Every day | CUTANEOUS | Status: DC
  Administered 2020-08-16 – 2020-08-24 (×8): 6 via TOPICAL

## 2020-08-16 MED ORDER — FREE WATER: 200.0000 mL | Freq: Four times a day (QID) | Status: DC

## 2020-08-16 MED ORDER — IPRATROPIUM-ALBUTEROL 0.5-2.5 (3) MG/3ML IN SOLN: 3.0000 mL | RESPIRATORY_TRACT | Status: DC | PRN

## 2020-08-16 MED ORDER — ACETAMINOPHEN 650 MG RE SUPP
650.0000 mg | Freq: Once | RECTAL | Status: AC
  Administered 2020-08-16: 650 mg via RECTAL
  Filled 2020-08-16: qty 1

## 2020-08-16 MED ORDER — POLYETHYLENE GLYCOL 3350 17 G PO PACK: 17.0000 g | PACK | Freq: Every day | ORAL | Status: DC | PRN

## 2020-08-16 MED ORDER — ALTEPLASE 2 MG IJ SOLR
2.0000 mg | Freq: Once | INTRAMUSCULAR | Status: AC
  Administered 2020-08-16: 2 mg
  Filled 2020-08-16: qty 2

## 2020-08-16 MED ORDER — SODIUM CHLORIDE 0.9 % IV SOLN
2.0000 g | Freq: Once | INTRAVENOUS | Status: AC
  Administered 2020-08-16: 2 g via INTRAVENOUS
  Filled 2020-08-16: qty 2

## 2020-08-16 MED ORDER — FOLIC ACID 1 MG PO TABS
2.0000 mg | ORAL_TABLET | Freq: Every day | ORAL | Status: DC
  Administered 2020-08-17 – 2020-08-24 (×8): 2 mg
  Filled 2020-08-16 (×8): qty 2

## 2020-08-16 MED ORDER — SODIUM CHLORIDE 0.9 % IV SOLN: INTRAVENOUS | Status: DC

## 2020-08-16 MED ORDER — FREE WATER
200.0000 mL | Status: DC
  Administered 2020-08-16 – 2020-08-17 (×4): 200 mL

## 2020-08-16 MED ORDER — ACETAMINOPHEN 650 MG RE SUPP: 650.0000 mg | Freq: Four times a day (QID) | RECTAL | Status: DC | PRN

## 2020-08-16 MED ORDER — ACETAMINOPHEN 160 MG/5ML PO SOLN
650.0000 mg | Freq: Four times a day (QID) | ORAL | Status: DC | PRN
  Administered 2020-08-17 (×2): 650 mg
  Filled 2020-08-16 (×2): qty 20.3

## 2020-08-16 NOTE — H&P (Addendum)
NAME:  Stuart Woods, MRN:  [id number], DOB:  [birth year], LOS: 0 ADMISSION DATE:  08/16/2020, CONSULTATION DATE:  08/16/20 REFERRING MD: Rhunette CroftNanavati , CHIEF COMPLAINT:  AMS   Brief History   Stuart Woods is a 54 yo M w/ PMh of ALS trach & vent dependent who [facility] due to encephalopathy. PCCM consulted for respiratory distress  History of present illness   Stuart Woods is a 54 yo M w/ PMH of ALS. History obtained via chart review. He was in his usual state of health until last Thursday when he was noted to be febrile, diaphoretic and altered. Chest X-ray at the faiclity showed RLL pneumonia and he was started on IV gentamicin. Despite the treatment, he did not have clinical improvement and [facility].  Attempted to call Mr.Bollen's spouse, Stuart Woods but no one picked up. Left voicemail with callback number  Chart review shows that at baseline, he is able to communicate with nodding and shaking his head and follow directions.  Past Medical History  ALS, Trach & vent dependence  Significant Hospital Events   08/16/20 Present to [facility]  Consults:   Procedures:   Significant Diagnostic Tests:  12/13 IMPRESSION: Tube and catheter positions as described without pneumothorax. Patchy bibasilar airspace opacity, more on the left than on the right. Suspect multifocal pneumonia, although aspiration could present similarly. Both aspiration and pneumonia may present concurrently. Heart size normal.   Micro Data:  08/16/20 COVID /Flu neg 08/16/20 Blood culture >> 08/16/20 Urine culture >>  Antimicrobials:  12/13 Vancomycin>> 12/13 Cefepime >> 12/13 Flagyl>>12/13  Interim history/subjective:   Examined and evaluated at bedside. Noted to be in respiratory distress. Diaphoretic. Unable to answer questions or follow directions.  Objective   Blood pressure 117/71, pulse 92, temperature (!) 102.2 F (39 C), temperature  source Rectal, resp. rate (!) 25, height 5\' 11"  (1.803 m), weight 49.9 kg, SpO2 100 %.    Vent Mode: PRVC FiO2 (%):  [60 %] 60 % Set Rate:  [25 bmp] 25 bmp Vt Set:  [500 mL] 500 mL PEEP:  [8 cmH20] 8 cmH20 Plateau Pressure:  [29 cmH20] 29 cmH20  No intake or output data in the 24 hours ending 08/16/20 1631 Filed Weights   08/16/20 1248  Weight: 49.9 kg    Examination:  Gen: ill-appearing, appear in distress HEENT: +temporal wasting, trach site with significant sputum production, grunting with respirations Neck: supple, no JVD CV: RRR, S1, S2 normal, No rubs, no murmurs, no gallops Pulm: Bilateral coarse breath sounds with bibasilar rales, no wheezes Abd: Soft, BS+, NTND Extm: No peripheral edema Skin: diaphoretic, Warm, poor turgor, multiple pressure wounds covered with bandaging Neuro: Unable to follow directions, non-verbal at baseline  Resolved Hospital Problem list     Assessment & Plan:   #Acute on chronic hypoxic respiratory failure #HCAP #Acute encephalopathy Prior baseline mentation awake, able to follow directions, shake head to questions per chart review. Present with ams after failing outpatient abx therapy with gentamicin.Febrile, tachypneic and X-ray w/ bibasilar opacities. COVID negative, likely bacterial pneumonia in setting of significant sputum production on exam. High risk for decompensation. Thankfully blood pressure is okay. Admit to ICU - NPO - F/u ABG - F/u blood/urine/resp culture - PRVC mechanical vent - VAP protocol - Frequent suctioning - C/w broad spectrum abx w/ vanc, cefepime - Hold home scopolamine, can cause mucus plugs  #Hypovolemic Hypernatremia NA 152. Appear volume down in setting of active infection. - Fluid  resuscitation with LR + Free water per tube - Trend bmp  #ALS On riluzole 50mg  BID - C/w home meds  #Poly-pharmacy On multiple central acting meds. Encephalopathy due to current infection but has multiple central acting  meds (oxycodone, ativan, gabapentin, trazodone, sertraline, benadryl) that may be contributing. - Re-introduce home psych meds as needed  #Chronic pressure ulcers Noted multiple ulcers on exam.  - Wound care consult  Best practice (evaluated daily)   Diet: NPO Pain/Anxiety/Delirium protocol (if indicated): Fentanyl prn VAP protocol (if indicated): per protocol DVT prophylaxis: lovenox GI prophylaxis: Famotidine Glucose control: SSI Mobility: BR last date of multidisciplinary goals of care discussion N/A Family and staff present N Summary of discussion Unable to reach family Follow up goals of care discussion due 08/17/20 Code Status: Full Disposition: ICU  Labs   CBC: Recent Labs  Lab 08/16/20 1303  WBC 16.3*  NEUTROABS 13.7*  HGB 10.3*  HCT 36.5*  MCV 95.5  PLT 687*    Basic Metabolic Panel: Recent Labs  Lab 08/16/20 1303  NA 152*  K 4.0  CL 100  CO2 40*  GLUCOSE 171*  BUN 58*  CREATININE <0.30*  CALCIUM 10.0   GFR: CrCl cannot be calculated (This lab value cannot be used to calculate CrCl because it is not a number: <0.30). Recent Labs  Lab 08/16/20 1303  WBC 16.3*  LATICACIDVEN 1.6    Liver Function Tests: Recent Labs  Lab 08/16/20 1303  AST 20  ALT 13  ALKPHOS 90  BILITOT 0.4  PROT 7.2  ALBUMIN 1.6*   No results for input(s): LIPASE, AMYLASE in the last 168 hours. No results for input(s): AMMONIA in the last 168 hours.  ABG    Component Value Date/Time   PHART 7.572 (H) 09/22/2019 0823   PCO2ART 38.8 09/22/2019 0823   PO2ART 114.0 (H) 09/22/2019 0823   HCO3 22.3 02/09/2020 0451   TCO2 23 02/09/2020 0451   ACIDBASEDEF 1.0 02/09/2020 0451   O2SAT 93.0 02/09/2020 0451     Coagulation Profile: Recent Labs  Lab 08/16/20 1303  INR 1.3*    Cardiac Enzymes: No results for input(s): CKTOTAL, CKMB, CKMBINDEX, TROPONINI in the last 168 hours.  HbA1C: Hgb A1c MFr Bld  Date/Time Value Ref Range Status  02/09/2020 02:48 AM 5.3 4.8  - 5.6 % Final    Comment:    (NOTE) Pre diabetes:          5.7%-6.4% Diabetes:              >6.4% Glycemic control for   <7.0% adults with diabetes     CBG: No results for input(s): GLUCAP in the last 168 hours.  Review of Systems:   Unable to obtain  Past Medical History  He,  has a past medical history of ALS (amyotrophic lateral sclerosis) (HCC).   Surgical History    Past Surgical History:  Procedure Laterality Date  . GASTROSTOMY TUBE PLACEMENT    . TRACHEOSTOMY       Social History    Requires total care. [facility] at [facility]. Wife is HPOA  Family History   His family history is not on file.   Allergies No Known Allergies   Home Medications  Prior to Admission medications   Medication Sig Start Date End Date Taking? Authorizing Provider  acetaminophen (TYLENOL) 500 MG tablet Place 500 mg into feeding tube every 12 (twelve) hours.     [provider]  Amino Acids-Protein Hydrolys (FEEDING SUPPLEMENT, PRO-STAT SUGAR FREE 64,)  LIQD Place 30 mLs into feeding tube daily.    [provider]  Artificial Saliva (BIOTENE ORALBALANCE DRY MOUTH) GEL Use as directed 1 application in the mouth or throat 3 (three) times daily.    [provider]  ascorbic acid (VITAMIN C) 500 MG tablet Place 500 mg into feeding tube daily.    [provider]  bisacodyl (DULCOLAX) 10 MG suppository Place 10 mg rectally daily as needed for moderate constipation.    [provider]  chlorhexidine (PERIDEX) 0.12 % solution Use as directed 15 mLs in the mouth or throat See admin instructions. Swab mouth with chlorhexidine every shift    [provider]  diphenhydrAMINE (BANOPHEN) 25 MG tablet 25 mg every 8 (eight) hours as needed for itching (PER TUBE).     [provider]  docusate (COLACE) 50 MG/5ML liquid Place 10 mLs (100 mg total) into feeding tube 2 (two) times daily. 02/09/20   Norton Blizzard, NP  enoxaparin (LOVENOX) 30  MG/0.3ML injection Inject 30 mg into the skin daily.    [provider]  folic acid (FOLVITE) 1 MG tablet Place 2 mg into feeding tube daily.    [provider]  gabapentin (NEURONTIN) 300 MG capsule Place 300 mg into feeding tube every 8 (eight) hours.     [provider]  hydrocortisone cream 1 % Apply 1 application topically every 8 (eight) hours as needed for itching.    [provider]  ipratropium-albuterol (DUONEB) 0.5-2.5 (3) MG/3ML SOLN Take 3 mLs by nebulization every 4 (four) hours as needed (shortness of breath/wheezing).     [provider]  Melatonin 3 MG TABS Give 6 mg by tube at bedtime.     [provider]  midodrine (PROAMATINE) 5 MG tablet Place 5 mg into feeding tube every 8 (eight) hours.    [provider]  Multiple Vitamins-Minerals (MULTIVITAMIN WITH MINERALS) tablet Place 1 tablet into feeding tube daily.     [provider]  nutrition supplement, JUVEN, (JUVEN) PACK Place 1 packet into feeding tube 2 (two) times daily between meals.    [provider]  Nutritional Supplements (COMPLEAT PEPTIDE 1.5) LIQD Place 1,300 mLs into feeding tube See admin instructions. Give at 55 ml/hr continuous for daily total of 1300 ml    [provider]  nystatin (MYCOSTATIN) 100000 UNIT/ML suspension Use as directed 5 mLs in the mouth or throat See admin instructions. Use foam sponge to apply to  Mouth/throat every 8 hours starting 01/19/2020 - suction mouth to prevent aspiration    [provider]  ondansetron (ZOFRAN) 4 MG tablet Place 4 mg into feeding tube every 6 (six) hours as needed for nausea or vomiting.    [provider]  oxyCODONE (OXY IR/ROXICODONE) 5 MG immediate release tablet Place 5 mg into feeding tube See admin instructions. Give one tablet (5 mg) per tube twice daily, also administer one tablet (5 mg) 30 minutes prior to wound care    [provider]  pantoprazole  sodium (PROTONIX) 40 mg/20 mL PACK Place 40 mg into feeding tube daily.    [provider]  polyethylene glycol (MIRALAX / GLYCOLAX) 17 g packet Take 17 g by mouth daily. 02/10/20   Norton Blizzard, NP  Probiotic Product (PROBIOTIC ADVANCED PO) Place 1 capsule into feeding tube every 12 (twelve) hours. Ultimate Flora 30 billion     [provider]  riluzole (RILUTEK) 50 MG tablet Place 50 mg into feeding tube  every 12 (twelve) hours.    [provider]  scopolamine (TRANSDERM-SCOP, 1.5 MG,) 1 MG/3DAYS Place 1 patch onto the skin every 3 (three) days.    [provider]  sennosides (SENOKOT) 8.8 MG/5ML syrup Place 5 mLs into feeding tube every 12 (twelve) hours.     [provider]  sertraline (ZOLOFT) 100 MG tablet Place 100 mg into feeding tube daily.    [provider]  simethicone (MYLICON) 80 MG chewable tablet Place 160 mg into feeding tube every 8 (eight) hours.     [provider]  sorbitol 70 % SOLN Take 30 mLs by mouth daily. 02/10/20   Norton Blizzard, NP  traZODone (DESYREL) 50 MG tablet Place 50 mg into feeding tube at bedtime.    [provider]  Water For Irrigation, Sterile (FREE WATER) SOLN Place 200 mLs into feeding tube every 6 (six) hours.    [provider]  zinc sulfate 220 (50 Zn) MG capsule Place 220 mg into feeding tube at bedtime.     [provider]    Critical care time:     Theotis Barrio, MD 08/16/2020, 5:05 PM PGY-3, [facility] Internal Medicine Pager: [phone]

## 2020-08-16 NOTE — Hospital Course (Signed)
Eye and mouth

## 2020-08-16 NOTE — Consult Note (Signed)
Date: 08/16/2020               Patient Name:  Stuart Woods MRN: [id number]  DOB: 04-Jun-1966 Age / Sex: 54 y.o., male   PCP: Pcp, No         Medical Service: Internal Medicine Teaching Service         Attending Physician: Dr. Judeth Horn, Lesia Sago, MD    First Contact: Dr. Quincy Simmonds Pager: [phone]  Second Contact: Dr. Dellia Cloud Pager: [phone]       After Hours (After 5p/  First Contact Pager: [phone]  weekends / holidays): Second Contact Pager: [phone]   Chief Complaint: AMS  History of Present Illness:  54 yo male with a past medical history of ALS on a trach dependent on vent presents to [facility] from [facility] for concern of altered mental status. History is limited due to altered mental status and inability to talk on vent. Rest of HPI per chart review.  At baseline patient is able to move eyes and mouth but today staff at [facility] noticed patient was less responsive. Patient also noted to be febrile and tachycardic appeared diaphoretic. Patient was started on IV gentamycin 4 days ago for pneumonia but stopped 2 days ago pending trough. CXR on 12/8 showing RLL and LLL pneumonia with bilateral pleural effusion. Did not have improvement at brought to [facility] ED for altered mental status.   ED course: Febrile with tmax of 102.2 and tachycardic to 110s and hypotensive with BP of 99/60. CMP with Na 152 , BUN of 58, bicarb 40. CBC with WBC of 16, hbg 10.3. Lactic acid and UA negative, COVID and influenza A and B negative. CXR with patchy bibasilar airspace opacity left more than right multifocal pneumonia vs aspiration.   Meds:  Current Meds  Medication Sig  . sodium chloride flush 0.9 % SOLN injection Inject 10 mLs into the vein as needed (before and after medication administration).    Allergies: Allergies as of 08/16/2020  . (No Known Allergies)   Past Medical History:  Diagnosis Date  . ALS (amyotrophic lateral sclerosis) (HCC)     Family History: No know past  family history  Social History: Married, from [facility].  Review of Systems: A complete ROS was negative except as per HPI.   Physical Exam: Blood pressure 96/69, pulse (!) 101, temperature (!) 102.2 F (39 C), temperature source Rectal, resp. rate 18, height 5\' 11"  (1.803 m), weight 49.9 kg, SpO2 97 %. Physical Exam Constitutional:      Appearance: He is ill-appearing and diaphoretic.  HENT:     Head: Normocephalic and atraumatic.     Right Ear: Ear canal and external ear normal.     Left Ear: Ear canal and external ear normal.     Nose: Nose normal.     Mouth/Throat:     Mouth: Mucous membranes are moist.     Comments: Secretions from trach Eyes:     Conjunctiva/sclera: Conjunctivae normal.     Pupils: Pupils are equal, round, and reactive to light.  Neck:     Comments: JVD Cardiovascular:     Rate and Rhythm: Tachycardia present.     Pulses: Normal pulses.     Heart sounds: No murmur heard.   Pulmonary:     Comments: Gurgling breath sounds bilaterally on anterior lung fields Abdominal:     General: Abdomen is flat. Bowel sounds are normal.     Palpations: Abdomen is soft.  Musculoskeletal:  Cervical back: Neck supple.     Right lower leg: Edema present.     Left lower leg: Edema present.  Skin:    Capillary Refill: Capillary refill takes less than 2 seconds.     Findings: No rash.     Comments: extremities cool  Neurological:     Mental Status: He is alert.     Comments: Flaccid paralysis of extremities, eyes open spontaneously, not responding to voice or noxious stimuli     EKG: personally reviewed my interpretation is HR 118, sinus tachycardia, LVH  DG Chest Port 1 View  Result Date: 08/16/2020 CLINICAL DATA:  Fever EXAM: PORTABLE CHEST 1 VIEW COMPARISON:  February 08, 2020 FINDINGS: Tracheostomy catheter tip is 8.0 cm above the carina. Central catheter tip is in the superior vena cava. No pneumothorax. There is airspace opacity in the left lower lobe  region. More subtle airspace opacity in the right base. Heart size and pulmonary vascular normal. No adenopathy. No bone lesions. IMPRESSION: Tube and catheter positions as described without pneumothorax. Patchy bibasilar airspace opacity, more on the left than on the right. Suspect multifocal pneumonia, although aspiration could present similarly. Both aspiration and pneumonia may present concurrently. Heart size normal. Electronically Signed   By: Bretta Bang III M.D.   On: 08/16/2020 13:04   Assessment & Plan by Problem: Active Problems:   Acute respiratory failure (HCC)  54 yo male with a past medical history of ALS on a trach dependent on vent presents to [facility] from [facility] for concern of altered mental status admitted for hypoxic respiratory failure and acute encephalopathy.   Acute hypoxemic respiratory failure  Healthcare acquired pneumonia  Acute encephalopathy  Patient presented with AMS normally responsive and able to move eyes and mouth. Today unable to respond. Found to be febrile, tachycardic, tachypnic, and hypotensive not responding to IV gentamycin CXR with bibasilar opacities concernig for bacterial pneumonia with possible aspiration. COVID negative. Started on broad spectrum antibiotics in ED, sp 1.5 L IVF.  -PCCM was consulted and will take over care in ICU  Dispo: Admit patient to Inpatient with expected length of stay greater than 2 midnights.  Signed: Quincy Simmonds, MD 08/16/2020, 5:30 PM  Pager: [phone] After 5pm on weekdays and 1pm on weekends: On Call pager: [phone]

## 2020-08-16 NOTE — ED Notes (Signed)
Pt's wife Khaalid Lefkowitz is his POA & does have copies of legal documents.

## 2020-08-16 NOTE — ED Provider Notes (Signed)
[facility] EMERGENCY DEPARTMENT Provider Note   CSN: [id number] Arrival date & time: 08/16/20  1224     History Chief Complaint  Patient presents with   Altered Mental Status   LEVEL 5 CAVEAT - ALTERED MENTAL STATUS/NONVERBAL ALS PATIENT  Stuart Woods is a 54 y.o. male with PMHx ALS (trach dependent) who presents to the ED today via [facility] from [facility] who presents to the ED with concern for AMS.  Per [facility] patient is typically able to move his eyes and mouth area however they noticed at [facility] that this was decreased today and our concern for an infection of some sort.  [facility] reports that patient was mildly febrile with them and tachycardic.   Additional information obtained by nursing staff at [facility].  Reports that she last saw him on Thursday however this morning he seemed to be not at his baseline.  It does appear that he was somewhat diaphoretic over the weekend however did not seem to have a fever.  She does mention that patient was recently diagnosed with pneumonia and has been on IV gentamicin.  She states that they have withheld it for the past 2 days waiting a trough level which is a send out lab for them.   CXR 12/08 at facility with RLL pneumonia. Right pleural efusion. LLL pneumonia. Left pleural effusion.   The history is provided by the nursing home, the EMS personnel and the spouse.       Past Medical History:  Diagnosis Date   ALS (amyotrophic lateral sclerosis) (HCC)     Patient Active Problem List   Diagnosis Date Noted   Acute respiratory failure (HCC) 08/16/2020   Small bowel obstruction (HCC) 02/09/2020   Malnutrition of moderate degree 02/09/2020   Abdominal pain 02/09/2020   SBO (small bowel obstruction) (HCC) 02/09/2020   ALS (amyotrophic lateral sclerosis) (HCC)    Partial intestinal obstruction (HCC)    Pneumothorax, right    Ventilator dependence (HCC)    Pressure injury of skin 09/23/2019    Acute encephalopathy 09/22/2019    Past Surgical History:  Procedure Laterality Date   GASTROSTOMY TUBE PLACEMENT     TRACHEOSTOMY         No family history on file.     Home Medications Prior to Admission medications   Medication Sig Start Date End Date Taking? Authorizing Provider  acetaminophen (TYLENOL) 500 MG tablet Place 500 mg into feeding tube every 12 (twelve) hours.    Yes [provider]  Amino Acids-Protein Hydrolys (FEEDING SUPPLEMENT, PRO-STAT SUGAR FREE 64,) LIQD Place 30 mLs into feeding tube daily.   Yes [provider]  Artificial Saliva (BIOTENE ORALBALANCE DRY MOUTH) GEL Use as directed 1 application in the mouth or throat 3 (three) times daily.   Yes [provider]  ascorbic acid (VITAMIN C) 500 MG tablet Place 500 mg into feeding tube every evening. 3pm   Yes [provider]  bisacodyl (DULCOLAX) 10 MG suppository Place 10 mg rectally daily as needed for moderate constipation.   Yes [provider]  chlorhexidine (PERIDEX) 0.12 % solution Use as directed 15 mLs in the mouth or throat See admin instructions. Swab mouth with chlorhexidine every shift   Yes [provider]  Cholecalciferol (VITAMIN D) 50 MCG (2000 UT) tablet Place 2,000 Units into feeding tube every evening. 3pm   Yes [provider]  cloNIDine (CATAPRES) 0.1 MG tablet Place 0.1 mg into feeding tube every 8 (eight) hours as  needed (hypertensive episodes related to ALS).   Yes [provider]  diphenhydrAMINE (BENADRYL) 25 MG tablet 25 mg every 8 (eight) hours as needed for itching.   Yes [provider]  enoxaparin (LOVENOX) 30 MG/0.3ML injection Inject 30 mg into the skin daily.   Yes [provider]  folic acid (FOLVITE) 1 MG tablet Place 2 mg into feeding tube every evening. 3pm   Yes [provider]  gabapentin (NEURONTIN) 300 MG capsule Place 300 mg into feeding tube every 8 (eight) hours.    Yes  [provider]  gentamicin (GARAMYCIN) 1.2-0.9 MG/ML-% Inject 120 mg into the vein daily.   Yes [provider]  glycopyrrolate (ROBINUL) 1 MG tablet Take 1 mg by mouth every 8 (eight) hours.   Yes [provider]  linaclotide (LINZESS) 145 MCG CAPS capsule Place 145 mcg into feeding tube daily before breakfast.   Yes [provider]  LORazepam (ATIVAN) 1 MG tablet Place 1 mg into feeding tube daily at 12 noon.   Yes [provider]  Melatonin 3 MG TABS Give 6 mg by tube at bedtime.    Yes [provider]  midodrine (PROAMATINE) 5 MG tablet Place 5 mg into feeding tube every 8 (eight) hours as needed (SBP < or = 90).   Yes [provider]  Multiple Vitamins-Minerals (MULTIVITAMIN WITH MINERALS) tablet Place 1 tablet into feeding tube daily.    Yes [provider]  nutrition supplement, JUVEN, (JUVEN) PACK Place 1 packet into feeding tube 2 (two) times daily.   Yes [provider]  Nutritional Supplements (COMPLEAT PEPTIDE 1.5) LIQD Place 25 mL/hr into feeding tube continuous.   Yes [provider]  ondansetron (ZOFRAN) 4 MG tablet Place 4 mg into feeding tube every 6 (six) hours as needed for nausea or vomiting.   Yes [provider]  oxyCODONE (OXY IR/ROXICODONE) 5 MG immediate release tablet Place 5 mg into feeding tube See admin instructions. Take one tablet (5 mg) per tube every 6 hours, may also take one tablet (5 mg) per tube 30 minutes prior to wound care   Yes [provider]  pantoprazole sodium (PROTONIX) 40 mg/20 mL PACK Place 40 mg into feeding tube daily.   Yes [provider]  polyethylene glycol (MIRALAX / GLYCOLAX) 17 g packet Take 17 g by mouth daily. Patient taking differently: Place 17 g into feeding tube daily. 02/10/20  Yes Norton BlizzardSimpson, Paula B, NP  Probiotic Product (PROBIOTIC ADVANCED PO) Place 1 capsule into feeding tube every 12 (twelve) hours. Ultimate Flora 30  billion   Yes [provider]  riluzole (RILUTEK) 50 MG tablet Place 50 mg into feeding tube every 12 (twelve) hours.   Yes [provider]  scopolamine (TRANSDERM-SCOP) 1 MG/3DAYS Place 1 patch onto the skin every 3 (three) days.   Yes [provider]  sennosides (SENOKOT) 8.8 MG/5ML syrup Place 5 mLs into feeding tube at bedtime.   Yes [provider]  sertraline (ZOLOFT) 100 MG tablet Place 100 mg into feeding tube daily.   Yes [provider]  shark liver oil-cocoa butter (PREPARATION H) 0.25-88.44 % suppository Place 1 suppository rectally every 6 (six) hours as needed for hemorrhoids.   Yes [provider]  simethicone (MYLICON) 80 MG chewable tablet Place 160 mg into feeding tube every 8 (eight) hours.    Yes [provider]  sodium chloride flush 0.9 % SOLN injection Inject 10 mLs into the vein as needed (  before and after medication administration/ flush each unused lumen).   Yes [provider]  traZODone (DESYREL) 50 MG tablet Place 50 mg into feeding tube at bedtime.   Yes [provider]  Water For Irrigation, Sterile (FREE WATER) SOLN Place 100 mLs into feeding tube every 6 (six) hours.   Yes [provider]  zinc sulfate 220 (50 Zn) MG capsule Place 220 mg into feeding tube at bedtime.    Yes [provider]  ipratropium-albuterol (DUONEB) 0.5-2.5 (3) MG/3ML SOLN Take 3 mLs by nebulization every 4 (four) hours as needed (shortness of breath/wheezing).  Patient not taking: No sig reported    [provider]  sorbitol 70 % SOLN Take 30 mLs by mouth daily. Patient not taking: Reported on 08/16/2020 02/10/20   Norton Blizzard, NP    Allergies    Patient has no known allergies.  Review of Systems   Review of Systems  Unable to perform ROS: Patient nonverbal  Constitutional: Positive for fever.    Physical Exam Updated Vital Signs BP 99/60 (BP Location: Left Arm)    Pulse (!)  117    Temp (!) 102.2 F (39 C) (Rectal)    Resp (!) 25    Ht 5\' 11"  (1.803 m)    Wt 49.9 kg    SpO2 95%    BMI 15.34 kg/m   Physical Exam Vitals and nursing note reviewed.  Constitutional:      Appearance: He is not ill-appearing.     Comments: Trach in place, ventilator dependent  HENT:     Head: Normocephalic and atraumatic.  Eyes:     Conjunctiva/sclera: Conjunctivae normal.  Cardiovascular:     Rate and Rhythm: Normal rate and regular rhythm.  Pulmonary:     Breath sounds: Rhonchi present.  Abdominal:     Palpations: Abdomen is soft.     Tenderness: There is no abdominal tenderness. There is no guarding or rebound.  Musculoskeletal:     Cervical back: Neck supple.  Skin:    General: Skin is warm and dry.  Neurological:     Mental Status: He is alert.     Comments: ALS patient, moving eye spontaneously, no spontaneous movement of extremities, flat facies     ED Results / Procedures / Treatments   Labs (all labs ordered are listed, but only abnormal results are displayed) Labs Reviewed  COMPREHENSIVE METABOLIC PANEL - Abnormal; Notable for the following components:      Result Value   Sodium 152 (*)    CO2 40 (*)    Glucose, Bld 171 (*)    BUN 58 (*)    Creatinine, Ser <0.30 (*)    Albumin 1.6 (*)    All other components within normal limits  CBC WITH DIFFERENTIAL/PLATELET - Abnormal; Notable for the following components:   WBC 16.3 (*)    RBC 3.82 (*)    Hemoglobin 10.3 (*)    HCT 36.5 (*)    MCHC 28.2 (*)    Platelets 687 (*)    Neutro Abs 13.7 (*)    Abs Immature Granulocytes 0.16 (*)    All other components within normal limits  PROTIME-INR - Abnormal; Notable for the following components:   Prothrombin Time 15.7 (*)    INR 1.3 (*)    All other components within normal limits  APTT - Abnormal; Notable for the following components:   aPTT 39 (*)    All other components within normal limits  URINALYSIS, ROUTINE  W REFLEX MICROSCOPIC - Abnormal; Notable  for the following components:   Color, Urine AMBER (*)    APPearance HAZY (*)    Protein, ur 30 (*)    Bacteria, UA RARE (*)    All other components within normal limits  I-STAT ARTERIAL BLOOD GAS, ED - Abnormal; Notable for the following components:   pH, Arterial 7.537 (*)    pCO2 arterial 48.7 (*)    pO2, Arterial 68 (*)    Bicarbonate 41.4 (*)    TCO2 43 (*)    Acid-Base Excess 17.0 (*)    Sodium 152 (*)    Potassium 3.1 (*)    HCT 27.0 (*)    Hemoglobin 9.2 (*)    All other components within normal limits  RESP PANEL BY RT-PCR (FLU A&B, COVID) ARPGX2  CULTURE, BLOOD (ROUTINE X 2)  CULTURE, BLOOD (ROUTINE X 2)  URINE CULTURE  CULTURE, RESPIRATORY  LACTIC ACID, PLASMA  PROCALCITONIN  PHOSPHORUS  MAGNESIUM  LACTIC ACID, PLASMA  LACTIC ACID, PLASMA  BLOOD GAS, ARTERIAL  STREP PNEUMONIAE URINARY ANTIGEN  CBC  BLOOD GAS, ARTERIAL  MAGNESIUM  PHOSPHORUS  COMPREHENSIVE METABOLIC PANEL    EKG EKG Interpretation  Date/Time:  Monday August 16 2020 12:34:47 EST Ventricular Rate:  118 PR Interval:    QRS Duration: 77 QT Interval:  319 QTC Calculation: 447 R Axis:   77 Text Interpretation: Sinus tachycardia Left ventricular hypertrophy ST elev, probable normal early repol pattern No acute changes No significant change since last tracing Confirmed by Derwood Kaplan ([id number]) on 08/16/2020 2:53:49 PM   Radiology DG Chest Port 1 View  Result Date: 08/16/2020 CLINICAL DATA:  Fever EXAM: PORTABLE CHEST 1 VIEW COMPARISON:  February 08, 2020 FINDINGS: Tracheostomy catheter tip is 8.0 cm above the carina. Central catheter tip is in the superior vena cava. No pneumothorax. There is airspace opacity in the left lower lobe region. More subtle airspace opacity in the right base. Heart size and pulmonary vascular normal. No adenopathy. No bone lesions. IMPRESSION: Tube and catheter positions as described without pneumothorax. Patchy bibasilar airspace opacity, more on the left than on  the right. Suspect multifocal pneumonia, although aspiration could present similarly. Both aspiration and pneumonia may present concurrently. Heart size normal. Electronically Signed   By: Bretta Bang III M.D.   On: 08/16/2020 13:04    Procedures .Critical Care Performed by: Tanda Rockers, PA-C Authorized by: Tanda Rockers, PA-C   Critical care provider statement:    Critical care time (minutes):  60   Critical care was necessary to treat or prevent imminent or life-threatening deterioration of the following conditions:  Sepsis   Critical care was time spent personally by me on the following activities:  Discussions with consultants, evaluation of patient's response to treatment, examination of patient, ordering and performing treatments and interventions, ordering and review of laboratory studies, ordering and review of radiographic studies, pulse oximetry, re-evaluation of patient's condition, obtaining history from patient or surrogate and review of old charts   (including critical care time)  Medications Ordered in ED Medications  vancomycin (VANCOREADY) IVPB 500 mg/100 mL (has no administration in time range)  ceFEPIme (MAXIPIME) 2 g in sodium chloride 0.9 % 100 mL IVPB (has no administration in time range)  sodium chloride flush (NS) 0.9 % injection 10-40 mL (10 mLs Intracatheter Given 08/16/20 1817)  sodium chloride flush (NS) 0.9 % injection 10-40 mL (10 mLs Intracatheter Given 08/16/20 1817)  Chlorhexidine Gluconate Cloth 2 % PADS 6 each (0  each Topical Hold 08/16/20 1748)  folic acid (FOLVITE) tablet 2 mg (has no administration in time range)  riluzole (RILUTEK) tablet 50 mg (has no administration in time range)  ipratropium-albuterol (DUONEB) 0.5-2.5 (3) MG/3ML nebulizer solution 3 mL (has no administration in time range)  docusate sodium (COLACE) capsule 100 mg (has no administration in time range)  polyethylene glycol (MIRALAX / GLYCOLAX) packet 17 g (has no  administration in time range)  enoxaparin (LOVENOX) injection 40 mg (has no administration in time range)  famotidine (PEPCID) IVPB 20 mg premix (has no administration in time range)  lactated ringers infusion ( Intravenous New Bag/Given 08/16/20 1821)  ondansetron (ZOFRAN) injection 4 mg (has no administration in time range)  free water 200 mL (has no administration in time range)  acetaminophen (TYLENOL) 160 MG/5ML solution 650 mg (has no administration in time range)    Or  acetaminophen (TYLENOL) suppository 650 mg (has no administration in time range)  sodium chloride 0.9 % bolus 1,000 mL (0 mLs Intravenous Stopped 08/16/20 1420)  ceFEPIme (MAXIPIME) 2 g in sodium chloride 0.9 % 100 mL IVPB (0 g Intravenous Stopped 08/16/20 1420)  metroNIDAZOLE (FLAGYL) IVPB 500 mg (0 mg Intravenous Stopped 08/16/20 1422)  vancomycin (VANCOCIN) IVPB 1000 mg/200 mL premix (0 mg Intravenous Stopped 08/16/20 1541)  sodium chloride 0.9 % bolus 500 mL (0 mLs Intravenous Stopped 08/16/20 1510)  acetaminophen (TYLENOL) suppository 650 mg (650 mg Rectal Given 08/16/20 1416)  alteplase (CATHFLO ACTIVASE) injection 2 mg (2 mg Intracatheter Given 08/16/20 1656)    ED Course  I have reviewed the triage vital signs and the nursing notes.  Pertinent labs & imaging results that were available during my care of the patient were reviewed by me and considered in my medical decision making (see chart for details).    MDM Rules/Calculators/A&P                          54 year old male with a history of ALS and trach dependency who presents to the ED today from [facility] with concern for infection.  Patient is noted to not be at baseline today.  He is typically able to move his eyes and move his mouth however was not doing this at [facility] place causing concern.  It does appear he was diagnosed with pneumonia last week and started on IV gentamicin however it was held for 2 days secondary to pending a trough level.   Arrival to the ED patient's rectal temp is 102.2 and he is tachycardic in the 110s.  He is hypotensive at 99/60 initially.  He has also tachypneic.  Patient meets sepsis criteria at this time.  Will start sepsis work-up at this time and cover for unknown infection with cefepime, Flagyl, Vanco.  Will provide Tylenol through his PEG tube.  Will add chest x-ray and assess for urinalysis at this time.  Wife has been updated and is on her way to see the patient.  CBC has returned with a leukocytosis of 16,000.  Chest x-ray with concern for pneumonia as well as possible underlying aspiration pneumonia.   Tylenol has been changed from per PEG to suppository per nursing staff request.   CMP with BUN 58, bicarb 40, and sodium 152. Creatinine normal at < 0.30. Pt receiving fluids at this time.  U/A without infection COVID and flu negative  Discussed case with IM who will come evaluate patient for admission. There is a question whether patient can go  to a stepdown unit given he is ventilator dependent. He will call me back if there are any issues. PCCM consulted.   This note was prepared using Dragon voice recognition software and may include unintentional dictation errors due to the inherent limitations of voice recognition software.  Final Clinical Impression(s) / ED Diagnoses Final diagnoses:  Sepsis with acute renal failure without septic shock, due to unspecified organism, unspecified acute renal failure type (HCC)  Healthcare-associated pneumonia  Tracheostomy dependent (HCC)  ALS (amyotrophic lateral sclerosis) [facility])    Rx / DC Orders ED Discharge Orders    None       Tanda Rockers, PA-C 08/16/20 1833    Derwood Kaplan, MD 08/17/20 [phone]

## 2020-08-16 NOTE — ED Triage Notes (Signed)
Arrived via [facility] from [facility]. Stated staff concern for altered mental status. Patient is end stage ALS and usually able to move eyes and jaw / mouth area and patient appears to have decreased activity with that, oncern for infection.

## 2020-08-16 NOTE — Progress Notes (Addendum)
Pharmacy Antibiotic Note  Stuart Woods is a 54 y.o. male admitted on 08/16/2020 from [facility] with concern of altered mental status. Patient is end stage ALS and trach/vent dependent.  Pharmacy has been consulted for vancomycin and cefepime dosing for sepsis, source unknown. Tmax 102.2. Scr < 0.3 - estimated CrCl ~76 using 0.8.   Plan: Cefepime 2g x1 followed by cefepime 2g q8h Vancomycin 1000mg  x1 followed by vancomycin 500mg  q12h Monitor renal function, cultures/sensitivities, and clinical progression      Temp (24hrs), Avg:102.2 F (39 C), Min:102.2 F (39 C), Max:102.2 F (39 C)  No results for input(s): WBC, CREATININE, LATICACIDVEN, VANCOTROUGH, VANCOPEAK, VANCORANDOM, GENTTROUGH, GENTPEAK, GENTRANDOM, TOBRATROUGH, TOBRAPEAK, TOBRARND, AMIKACINPEAK, AMIKACINTROU, AMIKACIN in the last 168 hours.  CrCl cannot be calculated (Patient's most recent lab result is older than the maximum 21 days allowed.).    No Known Allergies  Antimicrobials this admission: Vancomycin 12/13 >> Cefepime 12/13 >> Metronidazole x1 12/13  Dose adjustments this admission: N/a  Microbiology results: 12/13 bcx:  12/13 ucx:  Thank you for allowing pharmacy to be a part of this patient's care.  1/14, PharmD Clinical Pharmacist   08/16/2020 12:45 PM

## 2020-08-16 NOTE — Progress Notes (Signed)
Transported pt from Ed Rm 9 to 35M RM13. No complications noted

## 2020-08-16 NOTE — ED Notes (Signed)
Attempted report x1. 

## 2020-08-16 NOTE — Progress Notes (Signed)
eLink Physician-Brief Progress Note Patient Name: Stuart Woods DOB: July 10, 1966 MRN: [id number]   Date of Service  08/16/2020  HPI/Events of Note  ABG on 60%/PRVC /TV 500/P 8 = 7.383/69.5/24. Is this really a VBG and not an ABG. Sat by oximetry now = 98%  eICU Interventions  Plan: 1. Repeat ABG now.      Intervention Category Major Interventions: Respiratory failure - evaluation and management;Hypoxemia - evaluation and management  Lenell Antu 08/16/2020, 10:52 PM

## 2020-08-17 DIAGNOSIS — Y95 Nosocomial condition: Secondary | ICD-10-CM

## 2020-08-17 DIAGNOSIS — A419 Sepsis, unspecified organism: Secondary | ICD-10-CM

## 2020-08-17 DIAGNOSIS — J189 Pneumonia, unspecified organism: Secondary | ICD-10-CM

## 2020-08-17 DIAGNOSIS — Z930 Tracheostomy status: Secondary | ICD-10-CM

## 2020-08-17 DIAGNOSIS — J9600 Acute respiratory failure, unspecified whether with hypoxia or hypercapnia: Secondary | ICD-10-CM

## 2020-08-17 DIAGNOSIS — R6520 Severe sepsis without septic shock: Secondary | ICD-10-CM

## 2020-08-17 LAB — POCT I-STAT 7, (LYTES, BLD GAS, ICA,H+H)
Acid-Base Excess: 12 mmol/L — ABNORMAL HIGH (ref 0.0–2.0)
Bicarbonate: 38.2 mmol/L — ABNORMAL HIGH (ref 20.0–28.0)
Calcium, Ion: 1.31 mmol/L (ref 1.15–1.40)
HCT: 26 % — ABNORMAL LOW (ref 39.0–52.0)
Hemoglobin: 8.8 g/dL — ABNORMAL LOW (ref 13.0–17.0)
O2 Saturation: 95 %
Patient temperature: 96.5
Potassium: 3 mmol/L — ABNORMAL LOW (ref 3.5–5.1)
Sodium: 151 mmol/L — ABNORMAL HIGH (ref 135–145)
TCO2: 40 mmol/L — ABNORMAL HIGH (ref 22–32)
pCO2 arterial: 54.5 mmHg — ABNORMAL HIGH (ref 32.0–48.0)
pH, Arterial: 7.449 (ref 7.350–7.450)
pO2, Arterial: 73 mmHg — ABNORMAL LOW (ref 83.0–108.0)

## 2020-08-17 LAB — URINE CULTURE

## 2020-08-17 LAB — CBC
HCT: 32.5 % — ABNORMAL LOW (ref 39.0–52.0)
HCT: 27.8 % — ABNORMAL LOW (ref 39.0–52.0)
Hemoglobin: 9.6 g/dL — ABNORMAL LOW (ref 13.0–17.0)
Hemoglobin: 7.8 g/dL — ABNORMAL LOW (ref 13.0–17.0)
MCH: 27.4 pg (ref 26.0–34.0)
MCH: 26.6 pg (ref 26.0–34.0)
MCHC: 29.5 g/dL — ABNORMAL LOW (ref 30.0–36.0)
MCHC: 28.1 g/dL — ABNORMAL LOW (ref 30.0–36.0)
MCV: 92.9 fL (ref 80.0–100.0)
MCV: 94.9 fL (ref 80.0–100.0)
Platelets: 499 10*3/uL — ABNORMAL HIGH (ref 150–400)
Platelets: 460 10*3/uL — ABNORMAL HIGH (ref 150–400)
RBC: 3.5 MIL/uL — ABNORMAL LOW (ref 4.22–5.81)
RBC: 2.93 MIL/uL — ABNORMAL LOW (ref 4.22–5.81)
RDW: 15.6 % — ABNORMAL HIGH (ref 11.5–15.5)
RDW: 15.8 % — ABNORMAL HIGH (ref 11.5–15.5)
WBC: 13.8 10*3/uL — ABNORMAL HIGH (ref 4.0–10.5)
WBC: 12.3 10*3/uL — ABNORMAL HIGH (ref 4.0–10.5)
nRBC: 0 % (ref 0.0–0.2)
nRBC: 0 % (ref 0.0–0.2)

## 2020-08-17 LAB — COMPREHENSIVE METABOLIC PANEL
ALT: 12 U/L (ref 0–44)
AST: 19 U/L (ref 15–41)
Albumin: 1.4 g/dL — ABNORMAL LOW (ref 3.5–5.0)
Alkaline Phosphatase: 82 U/L (ref 38–126)
Anion gap: 11 (ref 5–15)
BUN: 38 mg/dL — ABNORMAL HIGH (ref 6–20)
CO2: 34 mmol/L — ABNORMAL HIGH (ref 22–32)
Calcium: 9.2 mg/dL (ref 8.9–10.3)
Chloride: 107 mmol/L (ref 98–111)
Creatinine, Ser: <0.3 mg/dL — ABNORMAL LOW (ref 0.61–1.24)
Glucose, Bld: 114 mg/dL — ABNORMAL HIGH (ref 70–99)
Potassium: 3.4 mmol/L — ABNORMAL LOW (ref 3.5–5.1)
Sodium: 152 mmol/L — ABNORMAL HIGH (ref 135–145)
Total Bilirubin: 0.6 mg/dL (ref 0.3–1.2)
Total Protein: 6.2 g/dL — ABNORMAL LOW (ref 6.5–8.1)

## 2020-08-17 LAB — STREP PNEUMONIAE URINARY ANTIGEN: Strep Pneumo Urinary Antigen: NEGATIVE

## 2020-08-17 LAB — GLUCOSE, CAPILLARY
Glucose-Capillary: 104 mg/dL — ABNORMAL HIGH (ref 70–99)
Glucose-Capillary: 105 mg/dL — ABNORMAL HIGH (ref 70–99)
Glucose-Capillary: 70 mg/dL (ref 70–99)
Glucose-Capillary: 73 mg/dL (ref 70–99)
Glucose-Capillary: 89 mg/dL (ref 70–99)

## 2020-08-17 LAB — LACTIC ACID, PLASMA
Lactic Acid, Venous: 3.3 mmol/L (ref 0.5–1.9)
Lactic Acid, Venous: 3.1 mmol/L (ref 0.5–1.9)

## 2020-08-17 LAB — TROPONIN I (HIGH SENSITIVITY)
Troponin I (High Sensitivity): 9 ng/L (ref <18)
Troponin I (High Sensitivity): 8 ng/L (ref <18)

## 2020-08-17 LAB — BASIC METABOLIC PANEL
Anion gap: 14 (ref 5–15)
BUN: 27 mg/dL — ABNORMAL HIGH (ref 6–20)
CO2: 31 mmol/L (ref 22–32)
Calcium: 9.4 mg/dL (ref 8.9–10.3)
Chloride: 107 mmol/L (ref 98–111)
Creatinine, Ser: <0.3 mg/dL — ABNORMAL LOW (ref 0.61–1.24)
Glucose, Bld: 102 mg/dL — ABNORMAL HIGH (ref 70–99)
Potassium: 3.8 mmol/L (ref 3.5–5.1)
Sodium: 152 mmol/L — ABNORMAL HIGH (ref 135–145)

## 2020-08-17 LAB — MAGNESIUM: Magnesium: 1.9 mg/dL (ref 1.7–2.4)

## 2020-08-17 LAB — PHOSPHORUS
Phosphorus: 2 mg/dL — ABNORMAL LOW (ref 2.5–4.6)
Phosphorus: 2.1 mg/dL — ABNORMAL LOW (ref 2.5–4.6)

## 2020-08-17 LAB — POTASSIUM: Potassium: 3.7 mmol/L (ref 3.5–5.1)

## 2020-08-17 MED ORDER — VITAL HIGH PROTEIN PO LIQD
1000.0000 mL | ORAL | Status: DC
  Filled 2020-08-17: qty 1000

## 2020-08-17 MED ORDER — LACTATED RINGERS IV BOLUS
1000.0000 mL | Freq: Once | INTRAVENOUS | Status: AC
  Administered 2020-08-17: 1000 mL via INTRAVENOUS

## 2020-08-17 MED ORDER — SODIUM CHLORIDE 0.9 % IV SOLN: 250.0000 mL | INTRAVENOUS | Status: DC

## 2020-08-17 MED ORDER — ORAL CARE MOUTH RINSE
15.0000 mL | OROMUCOSAL | Status: DC
  Administered 2020-08-17 – 2020-08-18 (×8): 15 mL via OROMUCOSAL

## 2020-08-17 MED ORDER — SODIUM CHLORIDE 0.9 % IV SOLN
1.0000 g | Freq: Four times a day (QID) | INTRAVENOUS | Status: DC
  Administered 2020-08-17 – 2020-08-18 (×2): 1 g via INTRAVENOUS
  Filled 2020-08-17 (×5): qty 1

## 2020-08-17 MED ORDER — COLLAGENASE 250 UNIT/GM EX OINT
TOPICAL_OINTMENT | Freq: Every day | CUTANEOUS | Status: DC
  Filled 2020-08-17 (×2): qty 30

## 2020-08-17 MED ORDER — DOCUSATE SODIUM 50 MG/5ML PO LIQD: 100.0000 mg | Freq: Two times a day (BID) | ORAL | Status: DC | PRN

## 2020-08-17 MED ORDER — GABAPENTIN 250 MG/5ML PO SOLN
300.0000 mg | Freq: Three times a day (TID) | ORAL | Status: DC
  Administered 2020-08-17 – 2020-08-24 (×22): 300 mg
  Filled 2020-08-17 (×25): qty 6

## 2020-08-17 MED ORDER — LINEZOLID 600 MG PO TABS
600.0000 mg | ORAL_TABLET | Freq: Two times a day (BID) | ORAL | Status: DC
  Administered 2020-08-17 – 2020-08-19 (×4): 600 mg
  Filled 2020-08-17 (×4): qty 1

## 2020-08-17 MED ORDER — POTASSIUM & SODIUM PHOSPHATES 280-160-250 MG PO PACK
1.0000 | PACK | Freq: Three times a day (TID) | ORAL | Status: AC
  Administered 2020-08-17 (×3): 1
  Filled 2020-08-17 (×3): qty 1

## 2020-08-17 MED ORDER — POLYETHYLENE GLYCOL 3350 17 G PO PACK: 17.0000 g | PACK | Freq: Every day | ORAL | Status: DC | PRN

## 2020-08-17 MED ORDER — PROSOURCE TF PO LIQD
45.0000 mL | Freq: Every day | ORAL | Status: DC
  Administered 2020-08-17 – 2020-08-24 (×8): 45 mL
  Filled 2020-08-17 (×8): qty 45

## 2020-08-17 MED ORDER — NOREPINEPHRINE 4 MG/250ML-% IV SOLN
INTRAVENOUS | Status: AC
  Filled 2020-08-17: qty 250

## 2020-08-17 MED ORDER — SODIUM CHLORIDE 0.9 % IV SOLN
250.0000 mL | INTRAVENOUS | Status: DC
  Administered 2020-08-17: 250 mL via INTRAVENOUS

## 2020-08-17 MED ORDER — FREE WATER
200.0000 mL | Status: DC
  Administered 2020-08-17 – 2020-08-19 (×20): 200 mL

## 2020-08-17 MED ORDER — MIDODRINE HCL 5 MG PO TABS
5.0000 mg | ORAL_TABLET | Freq: Three times a day (TID) | ORAL | Status: DC | PRN
  Administered 2020-08-17: 5 mg via ORAL
  Filled 2020-08-17: qty 1

## 2020-08-17 MED ORDER — ASPIRIN 81 MG PO CHEW
81.0000 mg | CHEWABLE_TABLET | Freq: Once | ORAL | Status: AC
  Administered 2020-08-17: 81 mg
  Filled 2020-08-17: qty 1

## 2020-08-17 MED ORDER — LACTATED RINGERS IV BOLUS
500.0000 mL | Freq: Once | INTRAVENOUS | Status: AC
  Administered 2020-08-17: 500 mL via INTRAVENOUS

## 2020-08-17 MED ORDER — LORAZEPAM 2 MG/ML IJ SOLN
1.0000 mg | Freq: Every day | INTRAMUSCULAR | Status: DC
  Administered 2020-08-17 – 2020-08-19 (×3): 1 mg via INTRAVENOUS
  Filled 2020-08-17 (×3): qty 1

## 2020-08-17 MED ORDER — ASPIRIN 81 MG PO CHEW: 81.0000 mg | CHEWABLE_TABLET | Freq: Once | ORAL | Status: DC

## 2020-08-17 MED ORDER — CHLORHEXIDINE GLUCONATE 0.12% ORAL RINSE (MEDLINE KIT)
15.0000 mL | Freq: Two times a day (BID) | OROMUCOSAL | Status: DC
  Administered 2020-08-17 – 2020-08-24 (×15): 15 mL via OROMUCOSAL

## 2020-08-17 MED ORDER — SODIUM CHLORIDE 0.9 % IV SOLN
INTRAVENOUS | Status: DC | PRN
  Administered 2020-08-17: 1000 mL via INTRAVENOUS

## 2020-08-17 MED ORDER — FAMOTIDINE 40 MG/5ML PO SUSR
40.0000 mg | Freq: Two times a day (BID) | ORAL | Status: DC
  Administered 2020-08-17 – 2020-08-24 (×15): 40 mg
  Filled 2020-08-17 (×15): qty 5

## 2020-08-17 MED ORDER — WHITE PETROLATUM EX OINT
TOPICAL_OINTMENT | CUTANEOUS | Status: AC
  Administered 2020-08-17: 0.2
  Filled 2020-08-17: qty 28.35

## 2020-08-17 MED ORDER — POTASSIUM CHLORIDE 10 MEQ/50ML IV SOLN
10.0000 meq | INTRAVENOUS | Status: AC
  Administered 2020-08-17 (×6): 10 meq via INTRAVENOUS
  Filled 2020-08-17 (×6): qty 50

## 2020-08-17 MED ORDER — NOREPINEPHRINE 4 MG/250ML-% IV SOLN
2.0000 ug/min | INTRAVENOUS | Status: DC
  Administered 2020-08-17: 2 ug/min via INTRAVENOUS

## 2020-08-17 MED ORDER — KATE FARMS STANDARD 1.4 PO LIQD
1440.0000 mL | ORAL | Status: DC
  Administered 2020-08-17 – 2020-08-18 (×3): 1440 mL
  Filled 2020-08-17 (×3): qty 1625

## 2020-08-17 MED ORDER — MELATONIN 3 MG PO TABS
3.0000 mg | ORAL_TABLET | Freq: Every day | ORAL | Status: DC
  Administered 2020-08-17: 3 mg
  Filled 2020-08-17: qty 1

## 2020-08-17 MED ORDER — JUVEN PO PACK
1.0000 | PACK | Freq: Two times a day (BID) | ORAL | Status: DC
  Administered 2020-08-17 – 2020-08-24 (×15): 1
  Filled 2020-08-17 (×15): qty 1

## 2020-08-17 MED ORDER — OXYCODONE HCL 5 MG PO TABS
5.0000 mg | ORAL_TABLET | Freq: Four times a day (QID) | ORAL | Status: DC
  Administered 2020-08-17 – 2020-08-24 (×29): 5 mg
  Filled 2020-08-17 (×29): qty 1

## 2020-08-17 MED ORDER — ASCORBIC ACID 500 MG PO TABS
500.0000 mg | ORAL_TABLET | Freq: Every day | ORAL | Status: DC
  Administered 2020-08-18 – 2020-08-24 (×7): 500 mg
  Filled 2020-08-17 (×9): qty 1

## 2020-08-17 NOTE — Progress Notes (Signed)
eLink Physician-Brief Progress Note Patient Name: Stuart Woods DOB: 02/14/1966 MRN: [id number]   Date of Service  08/17/2020  HPI/Events of Note  Multiple issues: 1. Hypotension - Patient on small dose of Norepinephrine IV infusion. Nursing request to restart home Midodrine and 2. Hypothermia - Request for [employer].   eICU Interventions  Plan: 1. Midodrine 5 mg per tube Q 8 hours PRN SBP <= 90. 2. Bair Hugger.      Intervention Category Major Interventions: Hypotension - evaluation and management;Other:  Lenell Antu 08/17/2020, 8:05 PM

## 2020-08-17 NOTE — Progress Notes (Signed)
eLink Physician-Brief Progress Note Patient Name: Stuart Woods DOB: [birth year] MRN: [id number]   Date of Service  08/17/2020  HPI/Events of Note  Hypophosphatemia - PO4--- = 2.0.   eICU Interventions  Will replace PO4---.     Intervention Category Major Interventions: Electrolyte abnormality - evaluation and management  [name] 08/17/2020, 1:20 AM

## 2020-08-17 NOTE — Progress Notes (Signed)
CSW spoke with Prem at [facility] who states the patient can return to the facility once medically ready.  Edwin Dada, MSW, LCSW-A Transitions of Care  Clinical Social Worker [facility] Emergency Departments  Medical ICU [phone]

## 2020-08-17 NOTE — Progress Notes (Signed)
Initial Nutrition Assessment  DOCUMENTATION CODES:   Underweight  INTERVENTION:   Initiate tube feeding via PEG: Molli Posey Standard 1.4 at 20 ml/h, increase by 10 ml every 8 hours to goal rate of 60 ml/h (1440 ml per day) Prosource TF 45 ml once daily  Provides 2056 kcal, 100 gm protein daily.  Juven BID via PEG, each packet provides 80 calories, 8 grams of carbohydrate, 2.5  grams of protein (collagen), 7 grams of L-arginine and 7 grams of L-glutamine; supplement contains CaHMB, Vitamins C, E, B12 and Zinc to promote wound healing.  NUTRITION DIAGNOSIS:   Increased nutrient needs related to chronic illness,wound healing (ALS) as evidenced by estimated needs.  GOAL:   Patient will meet greater than or equal to 90% of their needs  MONITOR:   Vent status,Labs,TF tolerance,Skin  REASON FOR ASSESSMENT:   Ventilator,Consult Enteral/tube feeding initiation and management  ASSESSMENT:   54 yo male admitted from [facility] with encephalopathy, HCAP. PMH includes ALS, trach, vent dependent, PEG, non-verbal.   Discussed patient in ICU rounds and with RN today. Received MD Consult for TF initiation and management. Family would like to start TF slowly since he has not had any TF for the past day.   Spoke with patient's wife at bedside. She states that patient has always had trouble with his stomach, even before ALS diagnosis. His stomach would get upset when he ate certain foods. At [facility], he was not tolerating tube feedings and he lost a lot of weight, so formula was changed to Compleat Peptide and he has tolerated that formula very well. He has gained a few pounds back since formula was changed. [place] formulary's similar formula is Molli Posey; discussed starting this formula with wife and she was very appreciative of information.   Patient is currently intubated on ventilator support MV: 10.5 L/min Temp (24hrs), Avg:96.9 F (36.1 C), Min:94.3 F (34.6 C), Max:98.6 F (37  C)   Labs reviewed. Sodium 151, K 3, BUN 38, Creatinine < 0.30, phos 2.1 CBG: 115-111  Medications reviewed and include folic acid, phos-nak, KCl.   Patient has severe depletion of muscle mass as expected with progressive muscular disorder (ALS).   NUTRITION - FOCUSED PHYSICAL EXAM:  Flowsheet Row Most Recent Value  Orbital Region Severe depletion  Upper Arm Region Severe depletion  Thoracic and Lumbar Region Severe depletion  Buccal Region Severe depletion  Temple Region Severe depletion  Clavicle Bone Region Severe depletion  Clavicle and Acromion Bone Region Severe depletion  Scapular Bone Region Unable to assess  Dorsal Hand Severe depletion  Patellar Region Severe depletion  Anterior Thigh Region Severe depletion  Posterior Calf Region Severe depletion  Edema (RD Assessment) Mild  Hair Reviewed  Eyes Reviewed  Mouth Reviewed  Skin Reviewed  Nails Reviewed       Diet Order:   Diet Order            Diet NPO time specified  Diet effective now                 EDUCATION NEEDS:   Education needs have been addressed, provided education on TF regimen to patient's wife  Skin:  Skin Assessment: Skin Integrity Issues: Skin Integrity Issues:: Unstageable Unstageable: L coccyx, L ischial tuberosity  Last BM:  12/14  Height:   Ht Readings from Last 1 Encounters:  08/16/20 5\' 11"  (1.803 m)    Weight:   Wt Readings from Last 1 Encounters:  08/17/20 51.8 kg  BMI:  Body mass index is 15.93 kg/m.  Estimated Nutritional Needs:   Kcal:  1950  Protein:  90-100 gm  Fluid:  >/= 2 L    Gabriel Rainwater, RD, LDN, CNSC Please refer to Amion for contact information.

## 2020-08-17 NOTE — Consult Note (Signed)
WOC Nurse wound consult note Consultation was completed by review of records, images and assistance from the bedside nurse/clinical staff.   Reason for Consult: multiple pressure injuries Wound type: Unstageable Pressure Injury: sacrum Unstageable Pressure Injury: left ischium  Pressure Injury POA: Yes Measurement: see nursing flow sheets Wound bed: Sacrum: 30% soft brown/white non viable tissue/70% pink tissue Left ischium: 95% yellow/white and black nonviable tissue/5% pink Drainage (amount, consistency, odor) see nursing flow sheets Periwound: intact  Dressing procedure/placement/frequency: Enzymatic debridement ointment to the sacrum and left ischium; to be applied daily either per bedside nursing or PT after hydrotherapy tx.  PT for hydrotherapy for mechanical debridement beginning tomorrow (first available appointment) LALM mattress in place while in the ICU will need if moves from the ICU; orders updated RD for maximization of nutritional supplementation for wounds Turn at least every 2 hours   WOC Nurse team will follow with you and see patient weekly with PT to determine continued need for therapy.   Please notify WOC nurses of any acute changes in the wounds or any new areas of concern Rajanae Mantia [facility] MSN, RN,CWOCN, CNS, CWON-AP [phone]

## 2020-08-17 NOTE — Plan of Care (Signed)
  Problem: Education: Goal: Knowledge of General Education information will improve Description: Including pain rating scale, medication(s)/side effects and non-pharmacologic comfort measures Outcome: Progressing   Problem: Health Behavior/Discharge Planning: Goal: Ability to manage health-related needs will improve Outcome: Progressing   Problem: Clinical Measurements: Goal: Diagnostic test results will improve Outcome: Progressing Goal: Respiratory complications will improve Outcome: Progressing Goal: Cardiovascular complication will be avoided Outcome: Progressing   Problem: Activity: Goal: Risk for activity intolerance will decrease Outcome: Progressing   Problem: Nutrition: Goal: Adequate nutrition will be maintained Outcome: Progressing   Problem: Coping: Goal: Level of anxiety will decrease Outcome: Progressing   Problem: Elimination: Goal: Will not experience complications related to bowel motility Outcome: Progressing Goal: Will not experience complications related to urinary retention Outcome: Progressing   Problem: Pain Managment: Goal: General experience of comfort will improve Outcome: Progressing   Problem: Safety: Goal: Ability to remain free from injury will improve Outcome: Progressing   Problem: Skin Integrity: Goal: Risk for impaired skin integrity will decrease Outcome: Progressing   

## 2020-08-17 NOTE — Progress Notes (Addendum)
NAME:  Stuart Woods, MRN:  [id number], DOB:  1966-04-13, LOS: 1 ADMISSION DATE:  08/16/2020, CONSULTATION DATE:  08/16/20 REFERRING MD: Rhunette Croft , CHIEF COMPLAINT:  AMS   Brief History   Mr.Stuart Woods is a 54 yo M w/ PMh of ALS trach & vent dependent who presents to [facility] from [facility] due to encephalopathy. PCCM consulted for respiratory distress  History of present illness   Mr.Stuart Woods is a 54 yo M w/ PMH of ALS. History obtained via chart review. He was in his usual state of health until last Thursday when he was noted to be febrile, diaphoretic and altered. Chest X-ray at the faiclity showed RLL pneumonia and he was started on IV gentamicin. Despite the treatment, he did not have clinical improvement and he was brought to [facility] for evaluation.  Attempted to call Mr.Stuart Woods's spouse, Ms.Emily Fendley but no one picked up. Left voicemail with callback number  Chart review shows that at baseline, he is able to communicate with nodding and shaking his head and follow directions.  Past Medical History  ALS, Trach & vent dependence  Significant Hospital Events   08/16/20 Present to [facility]  Consults:   Procedures:   Significant Diagnostic Tests:  12/13 IMPRESSION: Tube and catheter positions as described without pneumothorax. Patchy bibasilar airspace opacity, more on the left than on the right. Suspect multifocal pneumonia, although aspiration could present similarly. Both aspiration and pneumonia may present concurrently. Heart size normal.   Micro Data:  08/16/20 COVID /Flu neg 08/16/20 Blood culture >> 08/16/20 Urine culture >>  Antimicrobials:  12/13 Vancomycin>> 12/13 Cefepime >> 12/13 Flagyl>>12/13  Interim history/subjective:  No overnight events, awake, responds minimally to wife at the bedside.  This morning has had several episodes of hypotension that responded to IVF bolus  Objective   Blood pressure 109/70, pulse 79, temperature (!) 96.5 F (35.8  C), temperature source Oral, resp. rate (!) 21, height 5\' 11"  (1.803 m), weight 51.8 kg, SpO2 95 %.    Vent Mode: PRVC FiO2 (%):  [60 %-100 %] 60 % Set Rate:  [21 bmp-25 bmp] 21 bmp Vt Set:  [500 mL] 500 mL PEEP:  [8 cmH20] 8 cmH20 Plateau Pressure:  [28 cmH20-29 cmH20] 28 cmH20   Intake/Output Summary (Last 24 hours) at 08/17/2020 0755 Last data filed at 08/17/2020 0600 Gross per 24 hour  Intake 1268.13 ml  Output 450 ml  Net 818.13 ml   Filed Weights   08/16/20 1248 08/16/20 2100 08/17/20 0500  Weight: 49.9 kg 51.8 kg 51.8 kg    Examination:  Gen: ill-appearing, appear in distress HEENT: +temporal wasting, trach site with significant sputum production, grunting with respirations Neck: supple, no JVD CV: RRR, S1, S2 normal, No rubs, no murmurs, no gallops Pulm: Bilateral coarse breath sounds with bibasilar rales, no wheezes Abd: Soft, BS+, NTND Extm: No peripheral edema Skin: diaphoretic, Warm, poor turgor, multiple pressure wounds covered with bandaging Neuro: Unable to follow directions, non-verbal at baseline  Resolved Hospital Problem list     Assessment & Plan:   Acute on chronic hypoxic respiratory failure secondary to HCAP in the setting of 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 Present with ams after failing outpatient abx therapy with gentamicin. Febrile, tachypneic and X-ray w/ bibasilar opacities. COVID negative.  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 -HOB elevated 30  degrees. -Plateau pressures less than 30 cm H20.  -Follow chest x-ray, ABG prn.   -Bronchial hygiene and RT/bronchodilator protocol. -He is saturating well on FiO2 of 60% and PEEP 5, unclear what baseline setting are at [facility] -Lactic acid relatively flat at 3.0, trend -Peripheral dose Levophed if needed to maintain MAP >65   Acute Metabolic Encephalopathy Likely secondary to sepsis and sedating medications, slightly improved with AM    Hypovolemic Hypernatremia Na remains >150, free water deficit 2L P: -continue LR 150cc/hr -increase free water per tube to 200cc q2hr - Trend bmp  ALS On riluzole 50mg  BID P: - C/w home meds -Family concerned with pain control, resuming oxycodone and gabapentin today   Chronic pressure ulcers Noted multiple ulcers on exam.  - Wound care consult  Best practice (evaluated daily)   Diet: NPO Pain/Anxiety/Delirium protocol (if indicated): Fentanyl prn VAP protocol (if indicated): per protocol DVT prophylaxis: lovenox GI prophylaxis: Famotidine Glucose control: SSI Mobility: BR last date of multidisciplinary goals of care discussion: due 12/20 Family and staff present  Summary of discussion: Follow up goals of care discussion due: Family update: Pt's wife 1/21 at the bedside and updated, wants full code and aggressive care Code Status: Full Disposition: ICU  Labs   CBC: Recent Labs  Lab 08/16/20 1303 08/16/20 1731 08/16/20 2206 08/16/20 2349 08/17/20 0216 08/17/20 0352  WBC 16.3*  --   --   --  13.8*  --   NEUTROABS 13.7*  --   --   --   --   --   HGB 10.3* 9.2* 9.9* 9.2* 9.6* 8.8*  HCT 36.5* 27.0* 29.0* 27.0* 32.5* 26.0*  MCV 95.5  --   --   --  92.9  --   PLT 687*  --   --   --  499*  --     Basic Metabolic Panel: Recent Labs  Lab 08/16/20 1303 08/16/20 1731 08/16/20 2206 08/16/20 2257 08/16/20 2319 08/16/20 2349 08/17/20 0216 08/17/20 0352  NA 152* 152* 152*  --   --  152* 152* 151*  K 4.0 3.1* 3.2*  --   3.7 2.7* 3.4* 3.0*  CL 100  --   --   --   --   --  107  --   CO2 40*  --   --   --   --   --  34*  --   GLUCOSE 171*  --   --   --   --   --  114*  --   BUN 58*  --   --   --   --   --  38*  --   CREATININE <0.30*  --   --   --   --   --  <0.30*  --   CALCIUM 10.0  --   --   --   --   --  9.2  --   MG  --   --   --  2.0  --   --  1.9  --   PHOS  --   --   --  2.0*  --   --  2.1*  --    GFR: CrCl cannot be calculated (This lab value cannot be  used to calculate CrCl because it is not a number: <0.30). Recent Labs  Lab 08/16/20 1303 08/16/20 2257 08/17/20 0216  PROCALCITON 0.97  --   --   WBC 16.3*  --  13.8*  LATICACIDVEN 1.6 3.0* 3.3*    Liver Function Tests: Recent Labs  Lab 08/16/20 1303 08/17/20 0216  AST 20 19  ALT 13 12  ALKPHOS 90 82  BILITOT 0.4 0.6  PROT 7.2 6.2*  ALBUMIN 1.6* 1.4*   No results for input(s): LIPASE, AMYLASE in the last 168 hours. No results for input(s): AMMONIA in the last 168 hours.  ABG    Component Value Date/Time   PHART 7.449 08/17/2020 0352   PCO2ART 54.5 (H) 08/17/2020 0352   PO2ART 73 (L) 08/17/2020 0352   HCO3 38.2 (H) 08/17/2020 0352   TCO2 40 (H) 08/17/2020 0352   ACIDBASEDEF 1.0 02/09/2020 0451   O2SAT 95.0 08/17/2020 0352     Coagulation Profile: Recent Labs  Lab 08/16/20 1303  INR 1.3*    Cardiac Enzymes: No results for input(s): CKTOTAL, CKMB, CKMBINDEX, TROPONINI in the last 168 hours.  HbA1C: Hgb A1c MFr Bld  Date/Time Value Ref Range Status  02/09/2020 02:48 AM 5.3 4.8 - 5.6 % Final    Comment:    (NOTE) Pre diabetes:          5.7%-6.4% Diabetes:              >6.4% Glycemic control for   <7.0% adults with diabetes     CBG: Recent Labs  Lab 08/16/20 2115 08/16/20 2340  GLUCAP 115* 111*    Review of Systems:   Unable to obtain  Past Medical History  He,  has a past medical history of ALS (amyotrophic lateral sclerosis) (HCC).   Surgical History    Past Surgical History:  Procedure  Laterality Date  . GASTROSTOMY TUBE PLACEMENT    . TRACHEOSTOMY       Social History    Requires total care. [facility] Wife is HPOA  Family History   His family history is not on file.   Allergies No Known Allergies   Home Medications  Prior to Admission medications   Medication Sig Start Date End Date Taking? Authorizing Provider  acetaminophen (TYLENOL) 500 MG tablet Place 500 mg into feeding tube every 12 (twelve) hours.     [provider]  Amino Acids-Protein Hydrolys (FEEDING SUPPLEMENT, PRO-STAT SUGAR FREE 64,) LIQD Place 30 mLs into feeding tube daily.    [provider]  Artificial Saliva (BIOTENE ORALBALANCE DRY MOUTH) GEL Use as directed 1 application in the mouth or throat 3 (three) times daily.    [provider]  ascorbic acid (VITAMIN C) 500 MG tablet Place 500 mg into feeding tube daily.    [provider]  bisacodyl (DULCOLAX) 10 MG suppository Place 10 mg rectally daily as needed for moderate constipation.    [provider]  chlorhexidine (PERIDEX) 0.12 % solution Use as directed 15 mLs in the mouth or throat See admin instructions. Swab mouth with chlorhexidine every shift    [provider]  diphenhydrAMINE (BANOPHEN) 25 MG tablet 25 mg every 8 (eight) hours as needed for itching (PER TUBE).     [provider]  docusate (COLACE) 50 MG/5ML liquid Place 10 mLs (100 mg total) into feeding tube 2 (two) times daily. 02/09/20   Norton BlizzardSimpson, Paula B, NP  enoxaparin (LOVENOX) 30 MG/0.3ML injection Inject 30 mg into the skin daily.  [provider]  folic acid (FOLVITE) 1 MG tablet Place 2 mg into feeding tube daily.    [provider]  gabapentin (NEURONTIN) 300 MG capsule Place 300 mg into feeding tube every 8 (eight) hours.     [provider]  hydrocortisone cream 1 % Apply 1 application topically every 8 (eight) hours as needed for itching.    [provider]   ipratropium-albuterol (DUONEB) 0.5-2.5 (3) MG/3ML SOLN Take 3 mLs by nebulization every 4 (four) hours as needed (shortness of breath/wheezing).     [provider]  Melatonin 3 MG TABS Give 6 mg by tube at bedtime.     [provider]  midodrine (PROAMATINE) 5 MG tablet Place 5 mg into feeding tube every 8 (eight) hours.    [provider]  Multiple Vitamins-Minerals (MULTIVITAMIN WITH MINERALS) tablet Place 1 tablet into feeding tube daily.     [provider]  nutrition supplement, JUVEN, (JUVEN) PACK Place 1 packet into feeding tube 2 (two) times daily between meals.    [provider]  Nutritional Supplements (COMPLEAT PEPTIDE 1.5) LIQD Place 1,300 mLs into feeding tube See admin instructions. Give at 55 ml/hr continuous for daily total of 1300 ml    [provider]  nystatin (MYCOSTATIN) 100000 UNIT/ML suspension Use as directed 5 mLs in the mouth or throat See admin instructions. Use foam sponge to apply to  Mouth/throat every 8 hours starting 01/19/2020 - suction mouth to prevent aspiration    [provider]  ondansetron (ZOFRAN) 4 MG tablet Place 4 mg into feeding tube every 6 (six) hours as needed for nausea or vomiting.    [provider]  oxyCODONE (OXY IR/ROXICODONE) 5 MG immediate release tablet Place 5 mg into feeding tube See admin instructions. Give one tablet (5 mg) per tube twice daily, also administer one tablet (5 mg) 30 minutes prior to wound care    [provider]  pantoprazole sodium (PROTONIX) 40 mg/20 mL PACK Place 40 mg into feeding tube daily.    [provider]  polyethylene glycol (MIRALAX / GLYCOLAX) 17 g packet Take 17 g by mouth daily. 02/10/20   Norton Blizzard, NP  Probiotic Product (PROBIOTIC ADVANCED PO) Place 1 capsule into feeding tube every 12 (twelve) hours. Ultimate Flora 30 billion     [provider]  riluzole (RILUTEK) 50 MG tablet Place 50 mg into feeding tube  every 12 (twelve) hours.    [provider]  scopolamine (TRANSDERM-SCOP, 1.5 MG,) 1 MG/3DAYS Place 1 patch onto the skin every 3 (three) days.    [provider]  sennosides (SENOKOT) 8.8 MG/5ML syrup Place 5 mLs into feeding tube every 12 (twelve) hours.     [provider]  sertraline (ZOLOFT) 100 MG tablet Place 100 mg into feeding tube daily.    [provider]  simethicone (MYLICON) 80 MG chewable tablet Place 160 mg into feeding tube every 8 (eight) hours.     [provider]  sorbitol 70 % SOLN Take 30 mLs by mouth daily. 02/10/20   Norton Blizzard, NP  traZODone (DESYREL) 50 MG tablet Place 50 mg into feeding tube at bedtime.    [provider]  Water For Irrigation, Sterile (FREE WATER) SOLN Place 200 mLs into feeding tube every 6 (six) hours.    [provider]  zinc sulfate 220 (50 Zn) MG capsule Place 220 mg into feeding tube at bedtime.     [provider]    Critical care time: 40 minutes    CRITICAL CARE Performed by: Darcella Gasman Gulianna Hornsby   Total critical care time: 40 minutes  Critical care time was exclusive of separately billable procedures and treating other patients.  Critical care was necessary to treat or prevent imminent or life-threatening deterioration.  Critical care was time spent personally by me on the following activities: development of treatment plan with patient and/or surrogate as well as nursing, discussions with consultants, evaluation of patient's response to treatment, examination of patient, obtaining history from patient or surrogate, ordering and performing treatments and interventions, ordering and review of laboratory studies, ordering and review of radiographic studies, pulse oximetry and re-evaluation of patient's condition.  Darcella Gasman Fanchon Papania, PA-C  PCCM  Pager# [phone], if no answer [phone]

## 2020-08-17 NOTE — Progress Notes (Signed)
Physical Therapy Wound Eval and Treatment Patient Details  Name: Stuart Woods MRN: [id number] Date of Birth: December 28, 1965  Today's Date: 08/17/2020 Time: 1317-1400 Time Calculation (min): 43 min  Subjective  Subjective: Awake and alert on vent Patient and Family Stated Goals: heal wounds; see improvement Date of Onset:  (Unknown) Prior Treatments: Dressing changes  Pain Score:  Pt appeared upset initially upon turn and position, however wife present and talking to him   Wound Assessment     Pressure Injury 02/09/20 Coccyx Left Unstageable - Full thickness tissue loss in which the base of the injury is covered by slough (yellow, tan, gray, green or brown) and/or eschar (tan, brown or black) in the wound bed. 08/17/20 staging update at the ti (Active)  Wound Image   08/17/20 1420  Dressing Type ABD;Barrier Film (skin prep);Gauze (Comment);Moist to dry 08/17/20 1420  Dressing Changed;Clean;Dry;Intact 08/17/20 1420  Dressing Change Frequency Daily 08/17/20 1420  State of Healing Eschar 08/17/20 1420  Site / Wound Assessment Red;Yellow;Black 08/17/20 1420  % Wound base Red or Granulating 30% 08/17/20 1420  % Wound base Yellow/Fibrinous Exudate 50% 08/17/20 1420  % Wound base Black/Eschar 20% 08/17/20 1420  % Wound base Other/Granulation Tissue (Comment) 0% 08/17/20 1420  Peri-wound Assessment Pink;Erythema (blanchable);Intact 08/17/20 1420  Wound Length (cm) 4.5 cm 08/17/20 1420  Wound Width (cm) 4 cm 08/17/20 1420  Wound Depth (cm) 0.1 cm 08/17/20 1420  Wound Surface Area (cm^2) 18 cm^2 08/17/20 1420  Wound Volume (cm^3) 1.8 cm^3 08/17/20 1420  Tunneling (cm) 0 08/17/20 1420  Undermining (cm) 0 08/17/20 1420  Margins Unattached edges (unapproximated) 08/17/20 1420  Drainage Amount Minimal 08/17/20 1420  Drainage Description Serosanguineous 08/17/20 1420  Treatment Debridement (Selective);Hydrotherapy (Pulse lavage);Packing (Saline gauze) 08/17/20 1420   Santyl applied to wound bed  prior to applying dressing.       Pressure Injury 08/17/20 Ischial tuberosity Left Unstageable - Full thickness tissue loss in which the base of the injury is covered by slough (yellow, tan, gray, green or brown) and/or eschar (tan, brown or black) in the wound bed. (Active)  Wound Image   08/17/20 1420  Dressing Type ABD;Barrier Film (skin prep);Gauze (Comment);Moist to dry 08/17/20 1420  Dressing Changed;Clean;Dry;Intact 08/17/20 1420  Dressing Change Frequency Daily 08/17/20 1420  State of Healing Eschar 08/17/20 1420  Site / Wound Assessment Black;Pink;Yellow 08/17/20 1420  % Wound base Red or Granulating 10% 08/17/20 1420  % Wound base Yellow/Fibrinous Exudate 60% 08/17/20 1420  % Wound base Black/Eschar 30% 08/17/20 1420  % Wound base Other/Granulation Tissue (Comment) 0% 08/17/20 1420  Peri-wound Assessment Intact;Erythema (blanchable) 08/17/20 1420  Wound Length (cm) 4 cm 08/17/20 1420  Wound Width (cm) 4 cm 08/17/20 1420  Wound Depth (cm) 1.2 cm 08/17/20 1420  Wound Surface Area (cm^2) 16 cm^2 08/17/20 1420  Wound Volume (cm^3) 19.2 cm^3 08/17/20 1420  Undermining (cm) 3.2 cm from 10:00-12:00 08/17/20 1420  Margins Unattached edges (unapproximated) 08/17/20 1420  Drainage Amount Minimal 08/17/20 1420  Drainage Description Serosanguineous 08/17/20 1420  Treatment Debridement (Selective);Hydrotherapy (Pulse lavage);Packing (Saline gauze) 08/17/20 1420  Santyl applied to wound bed prior to applying dressing.   Hydrotherapy Pulsed lavage therapy - wound location: ischial tuberosity and sacrum Pulsed Lavage with Suction (psi): 8 psi Pulsed Lavage with Suction - Normal Saline Used: 1000 mL Pulsed Lavage Tip: Tip with splash shield Selective Debridement Selective Debridement - Location: ischial tuberosity and sacrum Selective Debridement - Tools Used: Forceps;Scalpel Selective Debridement - Tissue Removed: eschar and yellow slough  Wound Assessment and Plan  Wound Therapy -  Assess/Plan/Recommendations Wound Therapy - Clinical Statement: Pt presents to hydrotherapy with unstagable sacral and ischial tuberosity wounds. Debridement initiated. This patient will benefit from continued hydrotherapy for selective removal of unviable tissue, to decrease bioburden and promote wound bed healing. Wound Therapy - Functional Problem List: Global weakness due to ALS Factors Delaying/Impairing Wound Healing: Immobility;Multiple medical problems Hydrotherapy Plan: Debridement;Dressing change;Pulsatile lavage with suction;Patient/family education Wound Therapy - Frequency: 6X / week Wound Therapy - Follow Up Recommendations: Other (comment) (LTACH) Wound Plan: See above  Wound Therapy Goals- Improve the function of patient's integumentary system by progressing the wound(s) through the phases of wound healing (inflammation - proliferation - remodeling) by: Decrease Necrotic Tissue to: 20% Decrease Necrotic Tissue - Progress: Goal set today Increase Granulation Tissue to: 80% Increase Granulation Tissue - Progress: Goal set today Goals/treatment plan/discharge plan were made with and agreed upon by patient/family: Yes (Wife) Time For Goal Achievement: 7 days Wound Therapy - Potential for Goals: Good  Goals will be updated until maximal potential achieved or discharge criteria met.  Discharge criteria: when goals achieved, discharge from hospital, MD decision/surgical intervention, no progress towards goals, refusal/missing three consecutive treatments without notification or medical reason.  GP     Thelma Comp 08/17/2020, 2:35 PM   Rolinda Roan, PT, DPT Acute Rehabilitation Services Pager: [phone] Office: [phone]

## 2020-08-17 NOTE — Progress Notes (Signed)
2100: Patient arrived to 2M13. Responding to pain. VSS. Multiple wounds to skin. See chart for full assessment. Medications administered per MAR. Elink notified of arrival.

## 2020-08-17 NOTE — Consult Note (Signed)
[facility] for [facility]    Date of Admission:  08/16/2020     Total days of antibiotics 2               Reason for Consult: Pneumonia   Referring Provider: [place]  Primary Care Provider: Pcp, No   ASSESSMENT:  Stuart Woods is a 54 y/o with ALS admitted from [facility] with acute respiratory failure with concern for multidrug resistant pneumonia complicated by multiple unstageable pressure sores on the sacrum and left ischium. Currently requiring low dose vasopressor (with history of need for midodrine) and ventilatory support. On broad spectrum antimicrobial coverage with vancomycin and cefepime. Likely has pneumonia given acute onset worsening breathing and need for increased ventilation support although cannot rule out infection from his pressure ulcers. MRSA PCR is negative making MRSA pneumonia unlikely, however given his pressure wounds will change vancomycin to Zyvox for MRSA coverage at this point. Awaiting collection of respiratory cultures as most recent available information is from January 2021 with multi-drug resistant pseudomonas. Change cefepime to Recarbrio while awaiting respiratory culture results. Likely has osteomyelitis and would recommend imaging when stable. Continue to monitor blood cultures which are currently without growth to date. Frequent off loading of pressure injuries and optimize nutrition as able.   PLAN:  1. Change vancomycin and cefepime to Zyvox and Recarbrio.  2. Monitor platelet level while on Zyvox.  3. Obtain respiratory culture prior to starting Recarbrio.  4. Monitor blood cultures. 5. Consider imaging with suspected osteomyelitis of the sacrum 6. Respiratory and pressor support per primary team.  7. Wound care per Wound RN recommendations.    Active Problems:   Acute respiratory failure (HCC)   Healthcare-associated pneumonia   Sepsis with acute renal failure without septic shock ([place])   Tracheostomy dependent  (HCC)   Severe sepsis ([place])   . chlorhexidine gluconate (MEDLINE KIT)  15 mL Mouth Rinse BID  . Chlorhexidine Gluconate Cloth  6 each Topical Daily  . collagenase   Topical Daily  . enoxaparin (LOVENOX) injection  40 mg Subcutaneous Q24H  . famotidine  40 mg Per Tube BID  . feeding supplement (PROSource TF)  45 mL Per Tube Daily  . folic acid  2 mg Per Tube Daily  . free water  200 mL Per Tube Q2H  . gabapentin  300 mg Per Tube TID  . LORazepam  1 mg Intravenous QHS  . mouth rinse  15 mL Mouth Rinse 10 times per day  . nutrition supplement (JUVEN)  1 packet Per Tube BID  . oxyCODONE  5 mg Per Tube Q6H  . potassium & sodium phosphates  1 packet Per Tube Q8H  . riluzole  50 mg Per Tube Q12H  . sodium chloride flush  10-40 mL Intracatheter Q12H  . sodium chloride flush  10-40 mL Intracatheter Q12H     HPI: Stuart Woods is a 55 y.o. male with previous medical history of ALS who is trach and vent dependent admitted from [facility] with acute onset encephalopathy.   Stuart Woods began having fevers, sweats and altered mental status about 4 days prior to transfer to [facility]. Chest x-ray at [facility] showed right lower lobe pneumonia and was started on gentamycin.He did not have any clinical improvement and was transferred for further evaluation and care.   Chest x-ray on 12/13 with patchy bibasilar airspace opacity, greater on the left with suspicion for multifocal pneumonia or possibly aspiration. He also has multiple pressure injuries  on the sacrum and left ischium with recommendations for wound care including Santyl and hydrotherapy.  Stuart Woods was initially febrile with a temperature of 102.2 and has been borderline hypothermic since. Blood cultures are without growth to date. Urine culture with multiple species present and MRSA PCR negative. Initial antibiotic therapy with vancomycin and cefepime. Previous respiratory cultures from January 2021 with multidrug resistant  Pseudomonas aeruginosa (resistant to Cefepime, Ceftazidime, Ciprofloxacin, Imipenem and intermediate to gentamycin). Currently on full ventilation support with 50% O2 and 8 PEEP. Wife at bedside provides the history.   Review of Systems: Review of Systems  Unable to perform ROS: Mental status change     Past Medical History:  Diagnosis Date  . ALS (amyotrophic lateral sclerosis) ([place])        No family history on file.  No Known Allergies  OBJECTIVE: Blood pressure 101/72, pulse 73, temperature (!) 94.3 F (34.6 C), temperature source Axillary, resp. rate (!) 21, height '5\' 11"'  (1.803 m), weight 51.8 kg, SpO2 98 %.  Physical Exam Constitutional:      General: He is not in acute distress.    Appearance: He is well-developed and well-nourished. He is ill-appearing.     Comments: Lying in bed with head of bed elevated.   Cardiovascular:     Rate and Rhythm: Normal rate and regular rhythm.     Pulses: Intact distal pulses.     Heart sounds: Normal heart sounds.     Comments: Bilateral lower extremity edema 2+ on the right 1+ on the left. PICC line in right upper arm - dressing clean and dry without signs of infection.  Pulmonary:     Effort: Pulmonary effort is normal.     Breath sounds: Rhonchi present.     Comments: Tracheostomy present.  Abdominal:     General: Bowel sounds are normal.  Skin:    General: Skin is warm and dry.  Neurological:     Mental Status: He is alert and oriented to person, place, and time.  Psychiatric:        Mood and Affect: Mood and affect normal.        Behavior: Behavior normal.        Thought Content: Thought content normal.        Judgment: Judgment normal.     Lab Results Lab Results  Component Value Date   WBC 12.3 (H) 08/17/2020   HGB 7.8 (L) 08/17/2020   HCT 27.8 (L) 08/17/2020   MCV 94.9 08/17/2020   PLT 460 (H) 08/17/2020    Lab Results  Component Value Date   CREATININE <0.30 (L) 08/17/2020   BUN 27 (H) 08/17/2020   NA  152 (H) 08/17/2020   K 3.8 08/17/2020   CL 107 08/17/2020   CO2 31 08/17/2020    Lab Results  Component Value Date   ALT 12 08/17/2020   AST 19 08/17/2020   ALKPHOS 82 08/17/2020   BILITOT 0.6 08/17/2020     Microbiology: Recent Results (from the past 240 hour(s))  Blood Culture (routine x 2)     Status: None (Preliminary result)   Collection Time: 08/16/20  1:03 PM   Specimen: BLOOD  Result Value Ref Range Status   Specimen Description BLOOD SITE NOT SPECIFIED  Final   Special Requests   Final    BOTTLES DRAWN AEROBIC AND ANAEROBIC Blood Culture adequate volume   Culture   Final    NO GROWTH 1 DAY Performed at [facility], [address] [place], [place] [address]    Report Status PENDING  Incomplete  Resp Panel by RT-PCR (Flu A&B, Covid) Nasopharyngeal Swab     Status: None   Collection Time: 08/16/20  1:03 PM   Specimen: Nasopharyngeal Swab; Nasopharyngeal(NP) swabs in vial transport medium  Result Value Ref Range Status   SARS Coronavirus 2 by RT PCR NEGATIVE NEGATIVE Final    Comment: (NOTE) SARS-CoV-2 target nucleic acids are NOT DETECTED.  The SARS-CoV-2 RNA is generally detectable in upper respiratory specimens during the acute phase of infection. The lowest concentration of SARS-CoV-2 viral copies this assay can detect is 138 copies/mL. A negative result does not preclude SARS-Cov-2 infection and should not be used as the sole basis for treatment or other patient management decisions. A negative result may occur with  improper specimen collection/handling, submission of specimen other than nasopharyngeal swab, presence of viral mutation(s) within the areas targeted by this assay, and inadequate number of viral copies(<138 copies/mL). A negative result must be combined with clinical observations, patient history, and epidemiological information. The expected result is Negative.  Fact Sheet for Patients:   EntrepreneurPulse.com.au  Fact Sheet for Healthcare Providers:  IncredibleEmployment.be  This test is no t yet approved or cleared by the [place] FDA and  has been authorized for detection and/or diagnosis of SARS-CoV-2 by FDA under an Emergency Use Authorization (EUA). This EUA will remain  in effect (meaning this test can be used) for the duration of the COVID-19 declaration under Section 564(b)(1) of the Act, 21 U.S.C.section 360bbb-3(b)(1), unless the authorization is terminated  or revoked sooner.       Influenza A by PCR NEGATIVE NEGATIVE Final   Influenza B by PCR NEGATIVE NEGATIVE Final    Comment: (NOTE) The Xpert Xpress SARS-CoV-2/FLU/RSV plus assay is intended as an aid in the diagnosis of influenza from Nasopharyngeal swab specimens and should not be used as a sole basis for treatment. Nasal washings and aspirates are unacceptable for Xpert Xpress SARS-CoV-2/FLU/RSV testing.  Fact Sheet for Patients: EntrepreneurPulse.com.au  Fact Sheet for Healthcare Providers: IncredibleEmployment.be  This test is not yet approved or cleared by the [place] FDA and has been authorized for detection and/or diagnosis of SARS-CoV-2 by FDA under an Emergency Use Authorization (EUA). This EUA will remain in effect (meaning this test can be used) for the duration of the COVID-19 declaration under Section 564(b)(1) of the Act, 21 U.S.C. section 360bbb-3(b)(1), unless the authorization is terminated or revoked.  Performed at [facility], [place] [address], [place], [place] [address]   Urine culture     Status: Abnormal   Collection Time: 08/16/20  2:15 PM   Specimen: In/Out Cath Urine  Result Value Ref Range Status   Specimen Description IN/OUT CATH URINE  Final   Special Requests   Final    NONE Performed at [facility], [place] [address], [place], [place] [address]    Culture MULTIPLE  SPECIES PRESENT, SUGGEST RECOLLECTION (A)  Final   Report Status 08/17/2020 FINAL  Final  MRSA PCR Screening     Status: None   Collection Time: 08/16/20  9:21 PM   Specimen: Nasal Mucosa; Nasopharyngeal  Result Value Ref Range Status   MRSA by PCR NEGATIVE NEGATIVE Final    Comment:        The GeneXpert MRSA Assay (FDA approved for NASAL specimens only), is one component of a comprehensive MRSA colonization surveillance program. It is not intended to diagnose MRSA infection  nor to guide or monitor treatment for MRSA infections. Performed at [facility], [place] [address], [place], [place] [address]      Terri Piedra, [facility]  08/17/2020  4:10 PM

## 2020-08-17 NOTE — Progress Notes (Signed)
eLink Physician-Brief Progress Note Patient Name: [name] DOB: 09/29/1965 MRN: [id number]   Date of Service  08/17/2020  HPI/Events of Note  Multiple issues: 1. Hypokalemia - K+ = 2.7 and Creatinine < 0.30 and Lactic Acid = 1.6 --> 3.0.   eICU Interventions  Plan: 1. Replace K+. 2. Bolus with LR 1 liter IV over 1 hour now.      Intervention Category Major Interventions: Acid-Base disturbance - evaluation and management;Electrolyte abnormality - evaluation and management  Kaydense Rizo [name] 08/17/2020, 12:06 AM

## 2020-08-18 LAB — GLUCOSE, CAPILLARY
Glucose-Capillary: 105 mg/dL — ABNORMAL HIGH (ref 70–99)
Glucose-Capillary: 110 mg/dL — ABNORMAL HIGH (ref 70–99)
Glucose-Capillary: 114 mg/dL — ABNORMAL HIGH (ref 70–99)
Glucose-Capillary: 114 mg/dL — ABNORMAL HIGH (ref 70–99)
Glucose-Capillary: 90 mg/dL (ref 70–99)
Glucose-Capillary: 91 mg/dL (ref 70–99)

## 2020-08-18 LAB — BASIC METABOLIC PANEL
Anion gap: 9 (ref 5–15)
BUN: 28 mg/dL — ABNORMAL HIGH (ref 6–20)
CO2: 30 mmol/L (ref 22–32)
Calcium: 8.3 mg/dL — ABNORMAL LOW (ref 8.9–10.3)
Chloride: 104 mmol/L (ref 98–111)
Creatinine, Ser: <0.3 mg/dL — ABNORMAL LOW (ref 0.61–1.24)
Glucose, Bld: 128 mg/dL — ABNORMAL HIGH (ref 70–99)
Potassium: 3.2 mmol/L — ABNORMAL LOW (ref 3.5–5.1)
Sodium: 143 mmol/L (ref 135–145)

## 2020-08-18 LAB — CBC
HCT: 26.2 % — ABNORMAL LOW (ref 39.0–52.0)
Hemoglobin: 7.5 g/dL — ABNORMAL LOW (ref 13.0–17.0)
MCH: 26.8 pg (ref 26.0–34.0)
MCHC: 28.6 g/dL — ABNORMAL LOW (ref 30.0–36.0)
MCV: 93.6 fL (ref 80.0–100.0)
Platelets: 565 10*3/uL — ABNORMAL HIGH (ref 150–400)
RBC: 2.8 MIL/uL — ABNORMAL LOW (ref 4.22–5.81)
RDW: 15.7 % — ABNORMAL HIGH (ref 11.5–15.5)
WBC: 11.9 10*3/uL — ABNORMAL HIGH (ref 4.0–10.5)
nRBC: 0 % (ref 0.0–0.2)

## 2020-08-18 LAB — LACTIC ACID, PLASMA: Lactic Acid, Venous: 1.6 mmol/L (ref 0.5–1.9)

## 2020-08-18 MED ORDER — MIDODRINE HCL 5 MG PO TABS
5.0000 mg | ORAL_TABLET | Freq: Three times a day (TID) | ORAL | Status: DC
  Administered 2020-08-18 (×2): 5 mg via ORAL
  Filled 2020-08-18 (×2): qty 1

## 2020-08-18 MED ORDER — POTASSIUM CHLORIDE 10 MEQ/50ML IV SOLN
10.0000 meq | INTRAVENOUS | Status: AC
  Administered 2020-08-18 (×4): 10 meq via INTRAVENOUS
  Filled 2020-08-18 (×5): qty 50

## 2020-08-18 MED ORDER — ORAL CARE MOUTH RINSE
15.0000 mL | Freq: Four times a day (QID) | OROMUCOSAL | Status: DC
  Administered 2020-08-18 – 2020-08-24 (×25): 15 mL via OROMUCOSAL

## 2020-08-18 MED ORDER — MELATONIN 3 MG PO TABS
9.0000 mg | ORAL_TABLET | Freq: Every day | ORAL | Status: DC
  Administered 2020-08-18 – 2020-08-23 (×6): 9 mg
  Filled 2020-08-18 (×6): qty 3

## 2020-08-18 MED ORDER — FENTANYL CITRATE (PF) 100 MCG/2ML IJ SOLN
50.0000 ug | Freq: Every day | INTRAMUSCULAR | Status: DC | PRN
  Administered 2020-08-18 – 2020-08-24 (×7): 50 ug via INTRAVENOUS
  Filled 2020-08-18 (×7): qty 2

## 2020-08-18 MED ORDER — RILUZOLE 50 MG PO TABS
50.0000 mg | ORAL_TABLET | Freq: Two times a day (BID) | ORAL | Status: DC
  Administered 2020-08-18 – 2020-08-24 (×12): 50 mg
  Filled 2020-08-18 (×17): qty 1

## 2020-08-18 MED ORDER — KATE FARMS STANDARD 1.4 PO LIQD
1440.0000 mL | ORAL | Status: DC
  Administered 2020-08-19: 1440 mL
  Administered 2020-08-21: 650 mL
  Administered 2020-08-22: 1440 mL
  Administered 2020-08-24: 650 mL
  Filled 2020-08-18 (×10): qty 1625

## 2020-08-18 MED ORDER — SODIUM CHLORIDE 0.9 % IV SOLN
3.0000 g | Freq: Three times a day (TID) | INTRAVENOUS | Status: AC
  Administered 2020-08-18 – 2020-08-23 (×17): 3 g via INTRAVENOUS
  Filled 2020-08-18 (×25): qty 22.8

## 2020-08-18 MED ORDER — POTASSIUM CHLORIDE 20 MEQ PO PACK
20.0000 meq | PACK | ORAL | Status: AC
  Administered 2020-08-18 (×2): 20 meq
  Filled 2020-08-18 (×2): qty 1

## 2020-08-18 NOTE — Progress Notes (Signed)
[facility] for Infectious Disease  Date of Admission:  08/16/2020     Total days of antibiotics 3         ASSESSMENT:  [name] respiratory specimen is growing gram positive cocci and has been re-incubated for better growth. Hypothermic overnight night now improved. Remains on ventilatory support with O2 down to 40% and PEEP of 8 and on vasopressors with levophed. Midodrine being restarted. Continue current dose of Zyvox for now. Will change Recarbrio for Zyrbaxa as it has better MSSA coverage and should still provide adequate coverage for Pseudomonas. Monitor cultures for identification and narrow as able. Continue goals of care discussion given end stage ALS.   PLAN:  1. Continue Zyvox. Change Recarbrio to Zyrbaxa. 2. Monitor cultures organism identification 3. Ventilatory and vasopressor support per primary team.   Active Problems:   Acute respiratory failure ([place])   Healthcare-associated pneumonia   Sepsis with acute renal failure without septic shock ([place])   Tracheostomy dependent (HCC)   Severe sepsis ([place])   . vitamin C  500 mg Per Tube Daily  . chlorhexidine gluconate (MEDLINE KIT)  15 mL Mouth Rinse BID  . Chlorhexidine Gluconate Cloth  6 each Topical Daily  . collagenase   Topical Daily  . enoxaparin (LOVENOX) injection  40 mg Subcutaneous Q24H  . famotidine  40 mg Per Tube BID  . feeding supplement (PROSource TF)  45 mL Per Tube Daily  . folic acid  2 mg Per Tube Daily  . free water  200 mL Per Tube Q2H  . gabapentin  300 mg Per Tube TID  . linezolid  600 mg Per Tube Q12H  . LORazepam  1 mg Intravenous QHS  . mouth rinse  15 mL Mouth Rinse QID  . melatonin  9 mg Per Tube QHS  . midodrine  5 mg Oral TID WC  . nutrition supplement (JUVEN)  1 packet Per Tube BID  . oxyCODONE  5 mg Per Tube Q6H  . riluzole  50 mg Per Tube Q12H  . sodium chloride flush  10-40 mL Intracatheter Q12H  . sodium chloride flush  10-40 mL Intracatheter Q12H     SUBJECTIVE:  Hypothermic last night requiring bear hugger with temperature now normalized. Respiratory gram stain with gram positive cocci. Looks over when speaking with him with unclear communication.   No Known Allergies   Review of Systems: Review of Systems  Unable to perform ROS: Medical condition      OBJECTIVE: Vitals:   08/18/20 0630 08/18/20 0645 08/18/20 0700 08/18/20 0822  BP: 105/75 104/75 100/74 97/70  Pulse: 70 68 73   Resp: (!) 21 (!) 21 (!) 21   Temp:   (!) 96.2 F (35.7 C)   TempSrc:   Axillary   SpO2: 100% 100% 100% 100%  Weight:      Height:       Body mass index is 15.93 kg/m.  Physical Exam Constitutional:      General: He is not in acute distress.    Appearance: He is well-developed and well-nourished.     Comments: Lying in bed with head of bed elevated.   Cardiovascular:     Rate and Rhythm: Normal rate and regular rhythm.     Pulses: Intact distal pulses.     Heart sounds: Normal heart sounds.  Pulmonary:     Effort: Pulmonary effort is normal.     Breath sounds: Normal breath sounds.  Skin:    General: Skin is  warm and dry.  Neurological:     Mental Status: He is alert and oriented to person, place, and time.  Psychiatric:        Mood and Affect: Mood and affect normal.        Behavior: Behavior normal.        Thought Content: Thought content normal.        Judgment: Judgment normal.     Lab Results Lab Results  Component Value Date   WBC 11.9 (H) 08/18/2020   HGB 7.5 (L) 08/18/2020   HCT 26.2 (L) 08/18/2020   MCV 93.6 08/18/2020   PLT 565 (H) 08/18/2020    Lab Results  Component Value Date   CREATININE <0.30 (L) 08/18/2020   BUN 28 (H) 08/18/2020   NA 143 08/18/2020   K 3.2 (L) 08/18/2020   CL 104 08/18/2020   CO2 30 08/18/2020    Lab Results  Component Value Date   ALT 12 08/17/2020   AST 19 08/17/2020   ALKPHOS 82 08/17/2020   BILITOT 0.6 08/17/2020     Microbiology: Recent Results (from the past 240  hour(s))  Blood Culture (routine x 2)     Status: None (Preliminary result)   Collection Time: 08/16/20  1:03 PM   Specimen: BLOOD  Result Value Ref Range Status   Specimen Description BLOOD SITE NOT SPECIFIED  Final   Special Requests   Final    BOTTLES DRAWN AEROBIC AND ANAEROBIC Blood Culture adequate volume   Culture   Final    NO GROWTH 1 DAY Performed at [facility], [place] [address] [place], [place] [address]    Report Status PENDING  Incomplete  Resp Panel by RT-PCR (Flu A&B, Covid) Nasopharyngeal Swab     Status: None   Collection Time: 08/16/20  1:03 PM   Specimen: Nasopharyngeal Swab; Nasopharyngeal(NP) swabs in vial transport medium  Result Value Ref Range Status   SARS Coronavirus 2 by RT PCR NEGATIVE NEGATIVE Final    Comment: (NOTE) SARS-CoV-2 target nucleic acids are NOT DETECTED.  The SARS-CoV-2 RNA is generally detectable in upper respiratory specimens during the acute phase of infection. The lowest concentration of SARS-CoV-2 viral copies this assay can detect is 138 copies/mL. A negative result does not preclude SARS-Cov-2 infection and should not be used as the sole basis for treatment or other patient management decisions. A negative result may occur with  improper specimen collection/handling, submission of specimen other than nasopharyngeal swab, presence of viral mutation(s) within the areas targeted by this assay, and inadequate number of viral copies(<138 copies/mL). A negative result must be combined with clinical observations, patient history, and epidemiological information. The expected result is Negative.  Fact Sheet for Patients:  EntrepreneurPulse.com.au  Fact Sheet for Healthcare Providers:  IncredibleEmployment.be  This test is no t yet approved or cleared by the [place] FDA and  has been authorized for detection and/or diagnosis of SARS-CoV-2 by FDA under an Emergency Use Authorization (EUA).  This EUA will remain  in effect (meaning this test can be used) for the duration of the COVID-19 declaration under Section 564(b)(1) of the Act, 21 U.S.C.section 360bbb-3(b)(1), unless the authorization is terminated  or revoked sooner.       Influenza A by PCR NEGATIVE NEGATIVE Final   Influenza B by PCR NEGATIVE NEGATIVE Final    Comment: (NOTE) The Xpert Xpress SARS-CoV-2/FLU/RSV plus assay is intended as an aid in the diagnosis of influenza from Nasopharyngeal swab specimens and should not  be used as a sole basis for treatment. Nasal washings and aspirates are unacceptable for Xpert Xpress SARS-CoV-2/FLU/RSV testing.  Fact Sheet for Patients: EntrepreneurPulse.com.au  Fact Sheet for Healthcare Providers: IncredibleEmployment.be  This test is not yet approved or cleared by the [place] FDA and has been authorized for detection and/or diagnosis of SARS-CoV-2 by FDA under an Emergency Use Authorization (EUA). This EUA will remain in effect (meaning this test can be used) for the duration of the COVID-19 declaration under Section 564(b)(1) of the Act, 21 U.S.C. section 360bbb-3(b)(1), unless the authorization is terminated or revoked.  Performed at [facility], [place] [address] [place], [place] [address]   Urine culture     Status: Abnormal   Collection Time: 08/16/20  2:15 PM   Specimen: In/Out Cath Urine  Result Value Ref Range Status   Specimen Description IN/OUT CATH URINE  Final   Special Requests   Final    NONE Performed at [facility], [place] [address], [place], [place] [address]    Culture MULTIPLE SPECIES PRESENT, SUGGEST RECOLLECTION (A)  Final   Report Status 08/17/2020 FINAL  Final  MRSA PCR Screening     Status: None   Collection Time: 08/16/20  9:21 PM   Specimen: Nasal Mucosa; Nasopharyngeal  Result Value Ref Range Status   MRSA by PCR NEGATIVE NEGATIVE Final    Comment:        The GeneXpert MRSA  Assay (FDA approved for NASAL specimens only), is one component of a comprehensive MRSA colonization surveillance program. It is not intended to diagnose MRSA infection nor to guide or monitor treatment for MRSA infections. Performed at [facility], [place] [address], [place], [place] [address]   Culture, respiratory (non-expectorated)     Status: None (Preliminary result)   Collection Time: 08/17/20  4:39 PM   Specimen: Tracheal Aspirate; Respiratory  Result Value Ref Range Status   Specimen Description TRACHEAL ASPIRATE  Final   Special Requests NONE  Final   Gram Stain   Final    RARE WBC PRESENT,BOTH PMN AND MONONUCLEAR FEW GRAM POSITIVE COCCI    Culture   Final    CULTURE REINCUBATED FOR BETTER GROWTH Performed at [facility], [place] [address] [place], [place] [address]    Report Status PENDING  Incomplete     Terri Piedra, [place] [facility]  08/18/2020  10:25 AM

## 2020-08-18 NOTE — Progress Notes (Addendum)
NAME:  Stuart Woods, MRN:  [id number], DOB:  [birth year], LOS: 2 ADMISSION DATE:  08/16/2020, CONSULTATION DATE:  08/16/20 REFERRING MD: Rhunette Croft , CHIEF COMPLAINT:  AMS   Brief History   Stuart Woods is a 54 yo M w/ PMh of ALS trach & vent dependent who presents to [facility] from [facility] due to encephalopathy. PCCM consulted for respiratory distress  History of present illness   Stuart Woods is a 54 yo M w/ PMH of ALS. History obtained via chart review. He was in his usual state of health until last Thursday when he was noted to be febrile, diaphoretic and altered. Chest X-ray at the faiclity showed RLL pneumonia and he was started on IV gentamicin. Despite the treatment, he did not have clinical improvement and he was brought to [facility] for evaluation.  Attempted to call Stuart Woods's spouse, Ms.Emily Dollar but no one picked up. Left voicemail with callback number  Chart review shows that at baseline, he is able to communicate with nodding and shaking his head and follow directions.  Past Medical History  ALS, Trach & vent dependence  Significant Hospital Events   08/16/20 Present to [facility]  Consults:   Procedures:   Significant Diagnostic Tests:  12/13 IMPRESSION: Tube and catheter positions as described without pneumothorax. Patchy bibasilar airspace opacity, more on the left than on the right. Suspect multifocal pneumonia, although aspiration could present similarly. Both aspiration and pneumonia may present concurrently. Heart size normal.   Micro Data:  08/16/20 COVID /Flu neg 08/16/20 Blood culture >> 08/16/20 Urine culture >>  Antimicrobials:  12/13 Vancomycin>12/14 12/13 Cefepime > 12/14 12/13 Flagyl >12/13 Zyvox 12/14 >>> Recarbrio 12/14 >>>  Interim history/subjective:  Remained hypotensive overnight. Home midodrine restarted. Remains on norepinephrine 2.  Objective   Blood pressure 104/75, pulse 68, temperature (!) 95.1 F (35.1 C), temperature source  Axillary, resp. rate (!) 21, height 5\' 11"  (1.803 m), weight 51.8 kg, SpO2 100 %.    Vent Mode: PRVC FiO2 (%):  [50 %-60 %] 50 % Set Rate:  [21 bmp] 21 bmp Vt Set:  [500 mL] 500 mL PEEP:  [8 cmH20] 8 cmH20 Plateau Pressure:  [25 cmH20-28 cmH20] 25 cmH20   Intake/Output Summary (Last 24 hours) at 08/18/2020 0724 Last data filed at 08/18/2020 0600 Gross per 24 hour  Intake 8335.48 ml  Output 395 ml  Net 7940.48 ml   Filed Weights   08/16/20 1248 08/16/20 2100 08/17/20 0500  Weight: 49.9 kg 51.8 kg 51.8 kg    Examination:  Gen: middle aged male, frail, on vent, in no distress.  HEENT: Stuart Woods, Trach in place, site CDI. PERRL CV: RRR, S1, S2 normal, No rubs, no murmurs, no gallops Pulm: Clear bilateral breath sounds Abd: BS normoactive, mild distension but non-tender, soft.  Extm: No edema.  Skin: Sacral, ischial pressure wounds.   Neuro: Unable to follow directions, non-verbal at baseline  Resolved Hospital Problem list     Assessment & Plan:   Acute on chronic hypoxic respiratory failure secondary to HCAP  Septic shock Prior baseline mentation awake, able to follow directions, shake head to questions per chart review. Present with AMS after failing outpatient abx therapy with gentamicin. Febrile, tachypneic and X-ray w/ bibasilar opacities. COVID negative.  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 Appreciate their assistance.  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 - Full vent support - Bronchial hygeine - Riluzole 50mg  BID if we are able to obtain inpatient.  - Family concerned with pain control, oxycodone and gabapentin ordered  Acute Metabolic Encephalopathy  -  Likely secondary to sepsis and sedating medications  Hypovolemic Hypernatremia Hypokalemia Na remains >150, free water deficit 2L P: - DC LR now he is on TF and Free water - Trend bmp, Mg, phos - K replaced  Chronic pressure ulcers Noted multiple ulcers on exam.  - Wound care consulting  Goals of care: Full code aggressive measures. Patient has strict Jewish beliefs and has certain requirements should he pass while in the hospital. Please refer to letter provided by his wife, , should this unfortunate event take place. Notably, his body is not to be washed and call the funeral home immediately as he requires burial within 24 hours.   Best practice (evaluated daily)   Diet: NPO Pain/Anxiety/Delirium protocol (if indicated): Fentanyl prn VAP protocol (if indicated): per protocol DVT prophylaxis: lovenox GI prophylaxis: Famotidine Glucose control: SSI Mobility: BR last date of multidisciplinary goals of care discussion: due 12/20 Family and staff present  Summary of discussion: Follow up goals of care discussion due: Family update: Wife 1/21 updated bedside.  Code Status: Full Disposition: ICU  Labs   CBC: Recent Labs  Lab 08/16/20 1303 08/16/20 1731 08/16/20 2349 08/17/20 0216 08/17/20 0352 08/17/20 1130 08/18/20 0352  WBC 16.3*  --   --  13.8*  --  12.3* 11.9*  NEUTROABS 13.7*  --   --   --   --   --   --   HGB 10.3*   < > 9.2* 9.6* 8.8* 7.8* 7.5*  HCT 36.5*   < > 27.0* 32.5* 26.0* 27.8* 26.2*  MCV 95.5  --   --  92.9  --  94.9 93.6  PLT 687*  --   --  499*  --  460* 565*   < > = values in this interval not displayed.    Basic Metabolic Panel: Recent Labs  Lab 08/16/20 1303 08/16/20 1731 08/16/20 2257 08/16/20 2319 08/16/20 2349 08/17/20 0216 08/17/20 0352 08/17/20 1018 08/18/20 0352  NA 152*   < >  --   --  152* 152* 151* 152* 143  K 4.0   < >  --    < > 2.7* 3.4* 3.0* 3.8 3.2*  CL 100  --   --   --   --  107  --  107 104  CO2 40*  --   --    --   --  34*  --  31 30  GLUCOSE 171*  --   --   --   --  114*  --  102* 128*  BUN 58*  --   --   --   --  38*  --  27* 28*  CREATININE <0.30*  --   --   --   --  <0.30*  --  <0.30* <0.30*  CALCIUM 10.0  --   --   --   --  9.2  --  9.4 8.3*  MG  --   --  2.0  --   --  1.9  --   --   --   PHOS  --   --  2.0*  --   --  2.1*  --   --   --    < > =  values in this interval not displayed.   GFR: CrCl cannot be calculated (This lab value cannot be used to calculate CrCl because it is not a number: <0.30). Recent Labs  Lab 08/16/20 1303 08/16/20 2257 08/17/20 0216 08/17/20 1018 08/17/20 1130 08/18/20 0352 08/18/20 0405  PROCALCITON 0.97  --   --   --   --   --   --   WBC 16.3*  --  13.8*  --  12.3* 11.9*  --   LATICACIDVEN 1.6 3.0* 3.3* 3.1*  --   --  1.6    Liver Function Tests: Recent Labs  Lab 08/16/20 1303 08/17/20 0216  AST 20 19  ALT 13 12  ALKPHOS 90 82  BILITOT 0.4 0.6  PROT 7.2 6.2*  ALBUMIN 1.6* 1.4*   No results for input(s): LIPASE, AMYLASE in the last 168 hours. No results for input(s): AMMONIA in the last 168 hours.  ABG    Component Value Date/Time   PHART 7.449 08/17/2020 0352   PCO2ART 54.5 (H) 08/17/2020 0352   PO2ART 73 (L) 08/17/2020 0352   HCO3 38.2 (H) 08/17/2020 0352   TCO2 40 (H) 08/17/2020 0352   ACIDBASEDEF 1.0 02/09/2020 0451   O2SAT 95.0 08/17/2020 0352     Coagulation Profile: Recent Labs  Lab 08/16/20 1303  INR 1.3*    Cardiac Enzymes: No results for input(s): CKTOTAL, CKMB, CKMBINDEX, TROPONINI in the last 168 hours.  HbA1C: Hgb A1c MFr Bld  Date/Time Value Ref Range Status  02/09/2020 02:48 AM 5.3 4.8 - 5.6 % Final    Comment:    (NOTE) Pre diabetes:          5.7%-6.4% Diabetes:              >6.4% Glycemic control for   <7.0% adults with diabetes     CBG: Recent Labs  Lab 08/17/20 1213 08/17/20 1546 08/17/20 1954 08/17/20 2331 08/18/20 0335  GLUCAP 70 73 105* 104* 114*     Critical care time: 46 minutes      Joneen Roach, AGACNP-BC [facility]  See Amion for personal pager PCCM on call pager [phone]  08/18/2020 7:51 AM

## 2020-08-18 NOTE — Progress Notes (Signed)
K+ 3.2 Replaced per protocol  

## 2020-08-18 NOTE — Progress Notes (Signed)
Physical Therapy Wound Treatment Patient Details  Name: Stuart Woods MRN: [id number] Date of Birth: [birth year]  Today's Date: 08/18/2020 Time: 8119-1478 Time Calculation (min): 27 min  Subjective  Subjective: Awake and alert on vent Patient and Family Stated Goals: heal wounds; see improvement Date of Onset:  (Unknown) Prior Treatments: Dressing changes  Pain Score:  Pt premedicated and appeared to tolerate well.   Wound Assessment  Pressure Injury 02/09/20 Coccyx Left Unstageable - Full thickness tissue loss in which the base of the injury is covered by slough (yellow, tan, gray, green or brown) and/or eschar (tan, brown or black) in the wound bed. 08/17/20 staging update at the ti (Active)  Dressing Type ABD;Barrier Film (skin prep);Gauze (Comment);Moist to dry 08/18/20 1116  Dressing Changed;Clean;Dry;Intact 08/18/20 1116  Dressing Change Frequency Daily 08/18/20 1116  State of Healing Eschar 08/18/20 1116  Site / Wound Assessment Red;Yellow;Brown 08/18/20 1116  % Wound base Red or Granulating 30% 08/18/20 1116  % Wound base Yellow/Fibrinous Exudate 50% 08/18/20 1116  % Wound base Black/Eschar 20% 08/18/20 1116  % Wound base Other/Granulation Tissue (Comment) 0% 08/18/20 1116  Peri-wound Assessment Intact;Pink;Erythema (blanchable) 08/18/20 1116  Wound Length (cm) 4.5 cm 08/17/20 1420  Wound Width (cm) 4 cm 08/17/20 1420  Wound Depth (cm) 0.1 cm 08/17/20 1420  Wound Surface Area (cm^2) 18 cm^2 08/17/20 1420  Wound Volume (cm^3) 1.8 cm^3 08/17/20 1420  Tunneling (cm) 0 08/17/20 1420  Undermining (cm) 0 08/17/20 1420  Margins Unattached edges (unapproximated) 08/17/20 1420  Drainage Amount Minimal 08/18/20 1116  Drainage Description Serosanguineous 08/18/20 1116  Treatment Debridement (Selective);Hydrotherapy (Pulse lavage);Packing (Saline gauze) 08/18/20 1116   Santyl applied to wound bed prior to applying dressing.       Pressure Injury 08/17/20 Ischial tuberosity Left  Unstageable - Full thickness tissue loss in which the base of the injury is covered by slough (yellow, tan, gray, green or brown) and/or eschar (tan, brown or black) in the wound bed. (Active)  Dressing Type ABD;Barrier Film (skin prep);Gauze (Comment);Moist to dry 08/18/20 1116  Dressing Changed;Clean;Dry;Intact 08/18/20 1116  Dressing Change Frequency Daily 08/18/20 1116  State of Healing Non-healing 08/18/20 1116  Site / Wound Assessment Black;Pink;Yellow 08/18/20 1116  % Wound base Red or Granulating 40% 08/18/20 1116  % Wound base Yellow/Fibrinous Exudate 45% 08/18/20 1116  % Wound base Black/Eschar 15% 08/18/20 1116  % Wound base Other/Granulation Tissue (Comment) 0% 08/18/20 1116  Peri-wound Assessment Intact 08/18/20 1116  Wound Length (cm) 4 cm 08/17/20 1420  Wound Width (cm) 4 cm 08/17/20 1420  Wound Depth (cm) 1.2 cm 08/17/20 1420  Wound Surface Area (cm^2) 16 cm^2 08/17/20 1420  Wound Volume (cm^3) 19.2 cm^3 08/17/20 1420  Undermining (cm) 3.2 cm from 10:00-12:00 08/17/20 1420  Margins Unattached edges (unapproximated) 08/18/20 1116  Drainage Amount Minimal 08/18/20 1116  Drainage Description Serosanguineous 08/18/20 1116  Treatment Debridement (Selective);Hydrotherapy (Pulse lavage);Packing (Saline gauze) 08/18/20 1116  Santyl applied to wound bed prior to applying dressing.    Hydrotherapy Pulsed lavage therapy - wound location: ischial tuberosity and sacrum Pulsed Lavage with Suction (psi): 8 psi Pulsed Lavage with Suction - Normal Saline Used: 1000 mL Pulsed Lavage Tip: Tip with splash shield Selective Debridement Selective Debridement - Location: ischial tuberosity and sacrum Selective Debridement - Tools Used: Forceps;Scalpel Selective Debridement - Tissue Removed: eschar and yellow slough   Wound Assessment and Plan  Wound Therapy - Assess/Plan/Recommendations Wound Therapy - Clinical Statement: Progressing with debridement in both wounds. This patient will  benefit from  continued hydrotherapy for selective removal of unviable tissue, to decrease bioburden and promote wound bed healing. Wound Therapy - Functional Problem List: Global weakness due to ALS Factors Delaying/Impairing Wound Healing: Immobility;Multiple medical problems Hydrotherapy Plan: Debridement;Dressing change;Pulsatile lavage with suction;Patient/family education Wound Therapy - Frequency: 6X / week Wound Therapy - Follow Up Recommendations: Other (comment) (LTACH) Wound Plan: See above  Wound Therapy Goals- Improve the function of patient's integumentary system by progressing the wound(s) through the phases of wound healing (inflammation - proliferation - remodeling) by: Decrease Necrotic Tissue to: 20% Decrease Necrotic Tissue - Progress: Progressing toward goal Increase Granulation Tissue to: 80% Increase Granulation Tissue - Progress: Progressing toward goal Goals/treatment plan/discharge plan were made with and agreed upon by patient/family: No, Patient unable to participate in goals/treatment/discharge plan and family unavailable Time For Goal Achievement: 7 days Wound Therapy - Potential for Goals: Good  Goals will be updated until maximal potential achieved or discharge criteria met.  Discharge criteria: when goals achieved, discharge from hospital, MD decision/surgical intervention, no progress towards goals, refusal/missing three consecutive treatments without notification or medical reason.  GP     Thelma Comp 08/18/2020, 12:07 PM   Rolinda Roan, PT, DPT Acute Rehabilitation Services Pager: [phone] Office: [phone]

## 2020-08-19 ENCOUNTER — Encounter (HOSPITAL_COMMUNITY): Payer: Self-pay | Admitting: Pulmonary Disease

## 2020-08-19 DIAGNOSIS — J151 Pneumonia due to Pseudomonas: Secondary | ICD-10-CM

## 2020-08-19 DIAGNOSIS — A4152 Sepsis due to Pseudomonas: Secondary | ICD-10-CM

## 2020-08-19 LAB — BASIC METABOLIC PANEL
Anion gap: 8 (ref 5–15)
BUN: 22 mg/dL — ABNORMAL HIGH (ref 6–20)
CO2: 31 mmol/L (ref 22–32)
Calcium: 8.3 mg/dL — ABNORMAL LOW (ref 8.9–10.3)
Chloride: 104 mmol/L (ref 98–111)
Creatinine, Ser: <0.3 mg/dL — ABNORMAL LOW (ref 0.61–1.24)
Glucose, Bld: 161 mg/dL — ABNORMAL HIGH (ref 70–99)
Potassium: 3.6 mmol/L (ref 3.5–5.1)
Sodium: 143 mmol/L (ref 135–145)

## 2020-08-19 LAB — CBC
HCT: 24.4 % — ABNORMAL LOW (ref 39.0–52.0)
Hemoglobin: 7.7 g/dL — ABNORMAL LOW (ref 13.0–17.0)
MCH: 28 pg (ref 26.0–34.0)
MCHC: 31.6 g/dL (ref 30.0–36.0)
MCV: 88.7 fL (ref 80.0–100.0)
Platelets: 543 10*3/uL — ABNORMAL HIGH (ref 150–400)
RBC: 2.75 MIL/uL — ABNORMAL LOW (ref 4.22–5.81)
RDW: 15.7 % — ABNORMAL HIGH (ref 11.5–15.5)
WBC: 13.8 10*3/uL — ABNORMAL HIGH (ref 4.0–10.5)
nRBC: 0 % (ref 0.0–0.2)

## 2020-08-19 LAB — GLUCOSE, CAPILLARY
Glucose-Capillary: 89 mg/dL (ref 70–99)
Glucose-Capillary: 103 mg/dL — ABNORMAL HIGH (ref 70–99)
Glucose-Capillary: 91 mg/dL (ref 70–99)
Glucose-Capillary: 94 mg/dL (ref 70–99)
Glucose-Capillary: 106 mg/dL — ABNORMAL HIGH (ref 70–99)
Glucose-Capillary: 73 mg/dL (ref 70–99)

## 2020-08-19 LAB — PHOSPHORUS: Phosphorus: 2.8 mg/dL (ref 2.5–4.6)

## 2020-08-19 LAB — MAGNESIUM: Magnesium: 1.7 mg/dL (ref 1.7–2.4)

## 2020-08-19 MED ORDER — MIDODRINE HCL 5 MG PO TABS
5.0000 mg | ORAL_TABLET | Freq: Three times a day (TID) | ORAL | Status: DC
  Administered 2020-08-19 – 2020-08-24 (×17): 5 mg
  Filled 2020-08-19 (×17): qty 1

## 2020-08-19 MED ORDER — FREE WATER
200.0000 mL | Status: DC
  Administered 2020-08-19 – 2020-08-22 (×41): 200 mL

## 2020-08-19 MED ORDER — POTASSIUM CHLORIDE 20 MEQ PO PACK
40.0000 meq | PACK | Freq: Once | ORAL | Status: AC
  Administered 2020-08-19: 40 meq
  Filled 2020-08-19: qty 2

## 2020-08-19 MED ORDER — MAGNESIUM SULFATE 2 GM/50ML IV SOLN
2.0000 g | Freq: Once | INTRAVENOUS | Status: AC
  Administered 2020-08-19: 2 g via INTRAVENOUS
  Filled 2020-08-19: qty 50

## 2020-08-19 MED ORDER — FREE WATER
200.0000 mL | Status: DC
  Administered 2020-08-19: 200 mL

## 2020-08-19 NOTE — Progress Notes (Signed)
Physical Therapy Wound Treatment Patient Details  Name: Stuart Woods MRN: [id number] Date of Birth: June 19, 1966  Today's Date: 08/19/2020 Time: 1200-1230 Time Calculation (min): 30 min  Subjective  Subjective: Awake and alert on vent Patient and Family Stated Goals: heal wounds; see improvement Date of Onset:  (Unknown) Prior Treatments: Dressing changes  Pain Score:  Pt premedicated and appeared to tolerate well.   Wound Assessment  Pressure Injury 02/09/20 Coccyx Left Unstageable - Full thickness tissue loss in which the base of the injury is covered by slough (yellow, tan, gray, green or brown) and/or eschar (tan, brown or black) in the wound bed. 08/17/20 staging update at the ti (Active)  Dressing Type ABD;Barrier Film (skin prep);Gauze (Comment);Moist to dry 08/19/20 1240  Dressing Changed;Clean;Dry;Intact 08/19/20 1240  Dressing Change Frequency Daily 08/19/20 1240  State of Healing Non-healing 08/19/20 1240  Site / Wound Assessment Red;Yellow;Brown 08/19/20 1240  % Wound base Red or Granulating 30% 08/19/20 1240  % Wound base Yellow/Fibrinous Exudate 60% 08/19/20 1240  % Wound base Black/Eschar 10% 08/19/20 1240  % Wound base Other/Granulation Tissue (Comment) 0% 08/19/20 1240  Peri-wound Assessment Erythema (blanchable) 08/19/20 1240  Wound Length (cm) 4.5 cm 08/17/20 1420  Wound Width (cm) 4 cm 08/17/20 1420  Wound Depth (cm) 0.1 cm 08/17/20 1420  Wound Surface Area (cm^2) 18 cm^2 08/17/20 1420  Wound Volume (cm^3) 1.8 cm^3 08/17/20 1420  Tunneling (cm) 0 08/17/20 1420  Undermining (cm) 0 08/17/20 1420  Margins Unattached edges (unapproximated) 08/19/20 1240  Drainage Amount Minimal 08/19/20 1240  Drainage Description Serosanguineous 08/19/20 1240  Treatment Debridement (Selective);Hydrotherapy (Pulse lavage);Packing (Saline gauze) 08/19/20 1240   Santyl applied to wound bed prior to applying dressing.       Pressure Injury 08/17/20 Ischial tuberosity Left  Unstageable - Full thickness tissue loss in which the base of the injury is covered by slough (yellow, tan, gray, green or brown) and/or eschar (tan, brown or black) in the wound bed. (Active)  Dressing Type ABD;Barrier Film (skin prep);Gauze (Comment);Moist to dry 08/19/20 1240  Dressing Changed;Clean;Dry;Intact 08/19/20 1240  Dressing Change Frequency Daily 08/19/20 1240  State of Healing Non-healing 08/19/20 1240  Site / Wound Assessment Red;Yellow;Brown 08/19/20 1240  % Wound base Red or Granulating 50% 08/19/20 1240  % Wound base Yellow/Fibrinous Exudate 40% 08/19/20 1240  % Wound base Black/Eschar 10% 08/19/20 1240  % Wound base Other/Granulation Tissue (Comment) 0% 08/19/20 1240  Peri-wound Assessment Erythema (blanchable) 08/19/20 1240  Wound Length (cm) 4 cm 08/17/20 1420  Wound Width (cm) 4 cm 08/17/20 1420  Wound Depth (cm) 1.2 cm 08/17/20 1420  Wound Surface Area (cm^2) 16 cm^2 08/17/20 1420  Wound Volume (cm^3) 19.2 cm^3 08/17/20 1420  Undermining (cm) 3.2 cm from 10:00-12:00 08/17/20 1420  Margins Unattached edges (unapproximated) 08/19/20 1240  Drainage Amount Minimal 08/19/20 1240  Drainage Description Serosanguineous 08/19/20 1240  Treatment Debridement (Selective);Hydrotherapy (Pulse lavage);Packing (Saline gauze) 08/19/20 1240  Santyl applied to wound bed prior to applying dressing.   Hydrotherapy Pulsed lavage therapy - wound location: ischial tuberosity and sacrum Pulsed Lavage with Suction (psi): 8 psi Pulsed Lavage with Suction - Normal Saline Used: 1000 mL Pulsed Lavage Tip: Tip with splash shield Selective Debridement Selective Debridement - Location: ischial tuberosity and sacrum Selective Debridement - Tools Used: Forceps;Scalpel Selective Debridement - Tissue Removed: eschar and yellow slough   Wound Assessment and Plan  Wound Therapy - Assess/Plan/Recommendations Wound Therapy - Clinical Statement: Progressing with debridement. Ischial tuberosity wound  with improved wound bed appearance.  Sacral wound necrotic tissue is softening and able to be debrided more today. This patient will benefit from continued hydrotherapy for selective removal of unviable tissue, to decrease bioburden and promote wound bed healing. Wound Therapy - Functional Problem List: Global weakness due to ALS Factors Delaying/Impairing Wound Healing: Immobility;Multiple medical problems Hydrotherapy Plan: Debridement;Dressing change;Pulsatile lavage with suction;Patient/family education Wound Therapy - Frequency: 6X / week Wound Therapy - Follow Up Recommendations: Other (comment) (LTACH) Wound Plan: See above  Wound Therapy Goals- Improve the function of patient's integumentary system by progressing the wound(s) through the phases of wound healing (inflammation - proliferation - remodeling) by: Decrease Necrotic Tissue to: 20% Decrease Necrotic Tissue - Progress: Progressing toward goal Increase Granulation Tissue to: 80% Increase Granulation Tissue - Progress: Progressing toward goal Goals/treatment plan/discharge plan were made with and agreed upon by patient/family: No, Patient unable to participate in goals/treatment/discharge plan and family unavailable Time For Goal Achievement: 7 days Wound Therapy - Potential for Goals: Good  Goals will be updated until maximal potential achieved or discharge criteria met.  Discharge criteria: when goals achieved, discharge from hospital, MD decision/surgical intervention, no progress towards goals, refusal/missing three consecutive treatments without notification or medical reason.  GP     Stuart Woods 08/19/2020, 12:57 PM   Stuart Woods, PT, DPT Acute Rehabilitation Services Pager: [phone] Office: [phone]

## 2020-08-19 NOTE — Plan of Care (Signed)
Spoke with wife regarding wishes of no blood products if needed and to sign blood refusal form, she advised both she and the patient would accept blood products if needed. FYI removed from chart for refusal.  Problem: Education: Goal: Knowledge of General Education information will improve Description: Including pain rating scale, medication(s)/side effects and non-pharmacologic comfort measures Outcome: Not Progressing   Problem: Health Behavior/Discharge Planning: Goal: Ability to manage health-related needs will improve Outcome: Not Progressing   Problem: Clinical Measurements: Goal: Ability to maintain clinical measurements within normal limits will improve Outcome: Not Progressing Goal: Will remain free from infection Outcome: Not Progressing Goal: Diagnostic test results will improve Outcome: Not Progressing Goal: Respiratory complications will improve Outcome: Not Progressing Goal: Cardiovascular complication will be avoided Outcome: Not Progressing

## 2020-08-19 NOTE — Progress Notes (Signed)
NAME:  Stuart Woods, MRN:  [id number], DOB:  August 15, 1966, LOS: 3 ADMISSION DATE:  08/16/2020, CONSULTATION DATE:  08/16/20 REFERRING MD: Rhunette Croft , CHIEF COMPLAINT:  AMS   Brief History   Stuart Woods is a 54 yo M w/ PMH of ALS trach & vent dependent who presents to [facility] from [facility] due to encephalopathy. PCCM consulted for respiratory distress  History of present illness   Stuart Woods is a 54 yo M w/ PMH of ALS. History obtained via chart review. He was in his usual state of health until last Thursday when he was noted to be febrile, diaphoretic and altered. Chest X-ray at the faiclity showed RLL pneumonia and he was started on IV gentamicin. Despite the treatment, he did not have clinical improvement and he was brought to [facility] for evaluation.  Chart review shows that at baseline, he is able to communicate with nodding and shaking his head and follow directions.  Past Medical History  ALS, Trach & vent dependence  Significant Hospital Events   08/16/20 admitted for HAP in the setting of chronic respiratory failure secondary to ALS. Septic shock on levophed.  12/14 ID consulted and adjusted antibiotics to Zyvox and Recarbrio  12/15 Midodrine restarted, Levo off. Low tidal volumes on wean.   Consults:  Infectious disease  Procedures:   Significant Diagnostic Tests:  12/13 IMPRESSION: Tube and catheter positions as described without pneumothorax. Patchy bibasilar airspace opacity, more on the left than on the right. Suspect multifocal pneumonia, although aspiration could present similarly. Both aspiration and pneumonia may present concurrently. Heart size normal.   Micro Data:  08/16/20 COVID /Flu neg 08/16/20 Blood culture >> 08/16/20 Urine culture >>  Antimicrobials:  12/13 Vancomycin>12/14 12/13 Cefepime > 12/14 12/13 Flagyl >12/13 Recarbrio 12/14 Zyvox 12/14 > Caftolozane-tazobactam 12/15 >  Interim history/subjective:  No acute events overnight. Back on home  midodrine, now scheduled. Levophed has come off overnight.   Objective   Blood pressure (!) 90/58, pulse 63, temperature 98.7 F (37.1 C), temperature source Oral, resp. rate (!) 21, height 5\' 11"  (1.803 m), weight 60 kg, SpO2 100 %.    Vent Mode: PRVC FiO2 (%):  [40 %-50 %] 40 % Set Rate:  [21 bmp] 21 bmp Vt Set:  [500 mL] 500 mL PEEP:  [5 cmH20-8 cmH20] 8 cmH20 Plateau Pressure:  [13 cmH20-32 cmH20] 27 cmH20   Intake/Output Summary (Last 24 hours) at 08/19/2020 0736 Last data filed at 08/19/2020 0700 Gross per 24 hour  Intake 3874.06 ml  Output 3300 ml  Net 574.06 ml   Filed Weights   08/16/20 2100 08/17/20 0500 08/19/20 0445  Weight: 51.8 kg 51.8 kg 60 kg    Examination:  Gen: frail middle aged male on vent via trach in NAD  HEENT: [place]/AT, Trach in place, site CDI. PERRL CV: RRR, S1, S2 normal, No rubs, no murmurs, no gallops Pulm: Clear vent assisted breath sounds Abd: Bowel sounds normoactive.  Extm: no edema.  Skin: Sacral, ischial pressure wounds.   Neuro: Awake, alert, tracks.   Resolved Hospital Problem list     Assessment & Plan:   Acute on chronic hypoxic respiratory failure secondary to HCAP  Present with AMS after failing outpatient abx therapy with gentamicin. Febrile, tachypneic and X-ray w/ bibasilar opacities. COVID negative.  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 Appreciate their assistance.  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  - Full vent support - Bronchial hygeine - Riluzole 50mg  BID if we are able to obtain inpatient. - Oxycodone for pain  Acute Metabolic Encephalopathy: improving: Likely secondary to sepsis and sedating medications - Supportive care  Hypovolemic Hypernatremia:  improved Hypokalemia P: - TF and Free water - Trend bmp, Mg, phos - K replaced  Chronic pressure ulcers Noted multiple ulcers on exam.  - Wound care consulting  Goals of care: Full code aggressive measures. Patient has strict Jewish beliefs and has certain requirements should he pass while in the hospital. Please refer to letter provided by his wife, , should this unfortunate event take place. Notably, his body is not to be washed and call the funeral home immediately as he requires burial within 24 hours.   Likely ready for transfer to [facility] in the next 24 hours. Will get the process started.   Best practice (evaluated daily)   Diet: NPO, TF Pain/Anxiety/Delirium protocol (if indicated):Oxycodone. PRN Fentanyl VAP protocol (if indicated): per protocol DVT prophylaxis: lovenox GI prophylaxis: Famotidine Glucose control: SSI Mobility: BR last date of multidisciplinary goals of care discussion: due 12/20 Family and staff present  Summary of discussion: Follow up goals of care discussion due: Family update:Will call 1/21 with updates.   Code Status: Full Disposition: ICU  Labs   CBC: Recent Labs  Lab 08/16/20 1303 08/16/20 1731 08/17/20 0216 08/17/20 0352 08/17/20 1130 08/18/20 0352 08/19/20 0326  WBC 16.3*  --  13.8*  --  12.3* 11.9* 13.8*  NEUTROABS 13.7*  --   --   --   --   --   --   HGB 10.3*   < > 9.6* 8.8* 7.8* 7.5* 7.7*  HCT 36.5*   < > 32.5* 26.0* 27.8* 26.2* 24.4*  MCV 95.5  --  92.9  --  94.9 93.6 88.7  PLT 687*  --  499*  --  460* 565* 543*   < > = values in this interval not displayed.    Basic Metabolic Panel: Recent Labs  Lab 08/16/20 1303 08/16/20 1731 08/16/20 2257 08/16/20 2319 08/17/20 0216 08/17/20 0352 08/17/20 1018 08/18/20 0352 08/19/20 0326  NA 152*   < >  --    < > 152* 151* 152* 143 143  K 4.0   < >  --    < > 3.4* 3.0* 3.8 3.2* 3.6  CL 100  --   --   --  107  --  107 104 104  CO2 40*  --   --   --  34*  --  31 30 31    GLUCOSE 171*  --   --   --  114*  --  102* 128* 161*  BUN 58*  --   --   --  38*  --  27* 28* 22*  CREATININE <0.30*  --   --   --  <0.30*  --  <0.30* <0.30* <0.30*  CALCIUM 10.0  --   --   --  9.2  --  9.4 8.3* 8.3*  MG  --   --  2.0  --  1.9  --   --   --  1.7  PHOS  --   --  2.0*  --  2.1*  --   --   --  2.8   < > = values in this interval not displayed.   GFR:  CrCl cannot be calculated (This lab value cannot be used to calculate CrCl because it is not a number: <0.30). Recent Labs  Lab 08/16/20 1303 08/16/20 2257 08/17/20 0216 08/17/20 1018 08/17/20 1130 08/18/20 0352 08/18/20 0405 08/19/20 0326  PROCALCITON 0.97  --   --   --   --   --   --   --   WBC 16.3*  --  13.8*  --  12.3* 11.9*  --  13.8*  LATICACIDVEN 1.6 3.0* 3.3* 3.1*  --   --  1.6  --     Liver Function Tests: Recent Labs  Lab 08/16/20 1303 08/17/20 0216  AST 20 19  ALT 13 12  ALKPHOS 90 82  BILITOT 0.4 0.6  PROT 7.2 6.2*  ALBUMIN 1.6* 1.4*   No results for input(s): LIPASE, AMYLASE in the last 168 hours. No results for input(s): AMMONIA in the last 168 hours.  ABG    Component Value Date/Time   PHART 7.449 08/17/2020 0352   PCO2ART 54.5 (H) 08/17/2020 0352   PO2ART 73 (L) 08/17/2020 0352   HCO3 38.2 (H) 08/17/2020 0352   TCO2 40 (H) 08/17/2020 0352   ACIDBASEDEF 1.0 02/09/2020 0451   O2SAT 95.0 08/17/2020 0352     Coagulation Profile: Recent Labs  Lab 08/16/20 1303  INR 1.3*    Cardiac Enzymes: No results for input(s): CKTOTAL, CKMB, CKMBINDEX, TROPONINI in the last 168 hours.  HbA1C: Hgb A1c MFr Bld  Date/Time Value Ref Range Status  02/09/2020 02:48 AM 5.3 4.8 - 5.6 % Final    Comment:    (NOTE) Pre diabetes:          5.7%-6.4% Diabetes:              >6.4% Glycemic control for   <7.0% adults with diabetes     CBG: Recent Labs  Lab 08/18/20 1235 08/18/20 1633 08/18/20 1947 08/18/20 2341 08/19/20 0333  GLUCAP 105* 110* 91 90 89     Critical care time: 46  minutes     Joneen Roach, AGACNP-BC [place]  See Amion for personal pager PCCM on call pager [phone]  08/19/2020 7:36 AM

## 2020-08-19 NOTE — Progress Notes (Signed)
K+ 3.6, Mg 1.7 Replaced per protocol

## 2020-08-19 NOTE — Progress Notes (Signed)
[place] [facility] for Infectious Disease  Date of Admission:  08/16/2020     Total days of antibiotics 5         ASSESSMENT:  Mr. Purdy appears to be improving now with PEEP down to 5 with O2 of 40% and is off vasopressors and restarted on midodrine. Respiratory cultures remain pending and blood cultures have been without growth to date. Will continue with treatment for pneumonia and recommend 4 more days of Zyvox and Zyrbaxa through 12/20. Narrow antibiotics as able pending culture results. Recommend CT scan of pressure wounds to rule out osteomyelitis.   PLAN:  1. Continue Zyrbaxa and Zyvox through 12/20.  2. Monitor respiratory cultures and narrow antibiotics as able. 3. Ventilatory support per primary team.    Active Problems:   Acute respiratory failure (HCC)   Healthcare-associated pneumonia   Sepsis with acute renal failure without septic shock ([place])   Tracheostomy dependent (HCC)   Severe sepsis ([place])   . vitamin C  500 mg Per Tube Daily  . chlorhexidine gluconate (MEDLINE KIT)  15 mL Mouth Rinse BID  . Chlorhexidine Gluconate Cloth  6 each Topical Daily  . collagenase   Topical Daily  . enoxaparin (LOVENOX) injection  40 mg Subcutaneous Q24H  . famotidine  40 mg Per Tube BID  . feeding supplement (PROSource TF)  45 mL Per Tube Daily  . folic acid  2 mg Per Tube Daily  . free water  200 mL Per Tube Q2H  . gabapentin  300 mg Per Tube TID  . linezolid  600 mg Per Tube Q12H  . LORazepam  1 mg Intravenous QHS  . mouth rinse  15 mL Mouth Rinse QID  . melatonin  9 mg Per Tube QHS  . midodrine  5 mg Per Tube TID WC  . nutrition supplement (JUVEN)  1 packet Per Tube BID  . oxyCODONE  5 mg Per Tube Q6H  . riluzole  50 mg Per Tube Q12H  . sodium chloride flush  10-40 mL Intracatheter Q12H    SUBJECTIVE:  Afebrile overnight with no acute events.  No Known Allergies   Review of Systems: Review of Systems  Unable to perform ROS: Medical condition     OBJECTIVE: Vitals:   08/19/20 0915 08/19/20 0930 08/19/20 0945 08/19/20 1000  BP: (!) 90/58 (!) 90/57 96/63 93/62  Pulse: 67 74 82 77  Resp: (!) 21 (!) 21 (!) 21 (!) 21  Temp:      TempSrc:      SpO2: 100% 100% 94% 95%  Weight:      Height:       Body mass index is 18.45 kg/m.  Physical Exam Constitutional:      General: He is not in acute distress.    Appearance: He is well-developed and well-nourished.     Comments: Lying in bed with head of bed elevated.   Cardiovascular:     Rate and Rhythm: Normal rate and regular rhythm.     Pulses: Intact distal pulses.     Heart sounds: Normal heart sounds.  Pulmonary:     Effort: Pulmonary effort is normal.     Breath sounds: Rhonchi present.  Skin:    General: Skin is warm and dry.  Neurological:     Mental Status: He is alert.  Psychiatric:        Mood and Affect: Mood and affect normal.     Lab Results Lab Results  Component Value Date  WBC 13.8 (H) 08/19/2020   HGB 7.7 (L) 08/19/2020   HCT 24.4 (L) 08/19/2020   MCV 88.7 08/19/2020   PLT 543 (H) 08/19/2020    Lab Results  Component Value Date   CREATININE <0.30 (L) 08/19/2020   BUN 22 (H) 08/19/2020   NA 143 08/19/2020   K 3.6 08/19/2020   CL 104 08/19/2020   CO2 31 08/19/2020    Lab Results  Component Value Date   ALT 12 08/17/2020   AST 19 08/17/2020   ALKPHOS 82 08/17/2020   BILITOT 0.6 08/17/2020     Microbiology: Recent Results (from the past 240 hour(s))  Blood Culture (routine x 2)     Status: None (Preliminary result)   Collection Time: 08/16/20  1:03 PM   Specimen: BLOOD  Result Value Ref Range Status   Specimen Description BLOOD SITE NOT SPECIFIED  Final   Special Requests   Final    BOTTLES DRAWN AEROBIC AND ANAEROBIC Blood Culture adequate volume   Culture   Final    NO GROWTH 2 DAYS Performed at [facility], [place] [address], [place], [place] [address]    Report Status PENDING  Incomplete  Resp Panel by RT-PCR (Flu A&B,  Covid) Nasopharyngeal Swab     Status: None   Collection Time: 08/16/20  1:03 PM   Specimen: Nasopharyngeal Swab; Nasopharyngeal(NP) swabs in vial transport medium  Result Value Ref Range Status   SARS Coronavirus 2 by RT PCR NEGATIVE NEGATIVE Final    Comment: (NOTE) SARS-CoV-2 target nucleic acids are NOT DETECTED.  The SARS-CoV-2 RNA is generally detectable in upper respiratory specimens during the acute phase of infection. The lowest concentration of SARS-CoV-2 viral copies this assay can detect is 138 copies/mL. A negative result does not preclude SARS-Cov-2 infection and should not be used as the sole basis for treatment or other patient management decisions. A negative result may occur with  improper specimen collection/handling, submission of specimen other than nasopharyngeal swab, presence of viral mutation(s) within the areas targeted by this assay, and inadequate number of viral copies(<138 copies/mL). A negative result must be combined with clinical observations, patient history, and epidemiological information. The expected result is Negative.  Fact Sheet for Patients:  EntrepreneurPulse.com.au  Fact Sheet for Healthcare Providers:  IncredibleEmployment.be  This test is no t yet approved or cleared by the [place] FDA and  has been authorized for detection and/or diagnosis of SARS-CoV-2 by FDA under an Emergency Use Authorization (EUA). This EUA will remain  in effect (meaning this test can be used) for the duration of the COVID-19 declaration under Section 564(b)(1) of the Act, 21 U.S.C.section 360bbb-3(b)(1), unless the authorization is terminated  or revoked sooner.       Influenza A by PCR NEGATIVE NEGATIVE Final   Influenza B by PCR NEGATIVE NEGATIVE Final    Comment: (NOTE) The Xpert Xpress SARS-CoV-2/FLU/RSV plus assay is intended as an aid in the diagnosis of influenza from Nasopharyngeal swab specimens and should  not be used as a sole basis for treatment. Nasal washings and aspirates are unacceptable for Xpert Xpress SARS-CoV-2/FLU/RSV testing.  Fact Sheet for Patients: EntrepreneurPulse.com.au  Fact Sheet for Healthcare Providers: IncredibleEmployment.be  This test is not yet approved or cleared by the [place] FDA and has been authorized for detection and/or diagnosis of SARS-CoV-2 by FDA under an Emergency Use Authorization (EUA). This EUA will remain in effect (meaning this test can be used) for the duration of the COVID-19 declaration under  Section 564(b)(1) of the Act, 21 U.S.C. section 360bbb-3(b)(1), unless the authorization is terminated or revoked.  Performed at [facility], [place] [address], [place], [place] [address]   Urine culture     Status: Abnormal   Collection Time: 08/16/20  2:15 PM   Specimen: In/Out Cath Urine  Result Value Ref Range Status   Specimen Description IN/OUT CATH URINE  Final   Special Requests   Final    NONE Performed at [facility], [place] [address], [place], [place] [address]    Culture MULTIPLE SPECIES PRESENT, SUGGEST RECOLLECTION (A)  Final   Report Status 08/17/2020 FINAL  Final  MRSA PCR Screening     Status: None   Collection Time: 08/16/20  9:21 PM   Specimen: Nasal Mucosa; Nasopharyngeal  Result Value Ref Range Status   MRSA by PCR NEGATIVE NEGATIVE Final    Comment:        The GeneXpert MRSA Assay (FDA approved for NASAL specimens only), is one component of a comprehensive MRSA colonization surveillance program. It is not intended to diagnose MRSA infection nor to guide or monitor treatment for MRSA infections. Performed at [facility], [place] [address], [place], [place] [address]   Blood Culture (routine x 2)     Status: None (Preliminary result)   Collection Time: 08/16/20 11:12 PM   Specimen: BLOOD RIGHT HAND  Result Value Ref Range Status   Specimen Description BLOOD  RIGHT HAND  Final   Special Requests   Final    BOTTLES DRAWN AEROBIC AND ANAEROBIC Blood Culture adequate volume   Culture   Final    NO GROWTH 1 DAY Performed at [facility], [place] [address] [place], [place] [address]    Report Status PENDING  Incomplete  Culture, respiratory (non-expectorated)     Status: None (Preliminary result)   Collection Time: 08/17/20  4:39 PM   Specimen: Tracheal Aspirate; Respiratory  Result Value Ref Range Status   Specimen Description TRACHEAL ASPIRATE  Final   Special Requests NONE  Final   Gram Stain   Final    RARE WBC PRESENT,BOTH PMN AND MONONUCLEAR FEW GRAM POSITIVE COCCI    Culture   Final    CULTURE REINCUBATED FOR BETTER GROWTH Performed at [facility], [place] [address], [place],  [address]    Report Status PENDING  Incomplete     Terri Piedra, [facility]  08/19/2020  10:16 AM

## 2020-08-20 DIAGNOSIS — A498 Other bacterial infections of unspecified site: Secondary | ICD-10-CM

## 2020-08-20 LAB — CBC
HCT: 25.6 % — ABNORMAL LOW (ref 39.0–52.0)
Hemoglobin: 7.8 g/dL — ABNORMAL LOW (ref 13.0–17.0)
MCH: 27.4 pg (ref 26.0–34.0)
MCHC: 30.5 g/dL (ref 30.0–36.0)
MCV: 89.8 fL (ref 80.0–100.0)
Platelets: 546 10*3/uL — ABNORMAL HIGH (ref 150–400)
RBC: 2.85 MIL/uL — ABNORMAL LOW (ref 4.22–5.81)
RDW: 15.4 % (ref 11.5–15.5)
WBC: 14.6 10*3/uL — ABNORMAL HIGH (ref 4.0–10.5)
nRBC: 0 % (ref 0.0–0.2)

## 2020-08-20 LAB — GLUCOSE, CAPILLARY
Glucose-Capillary: 75 mg/dL (ref 70–99)
Glucose-Capillary: 114 mg/dL — ABNORMAL HIGH (ref 70–99)
Glucose-Capillary: 121 mg/dL — ABNORMAL HIGH (ref 70–99)
Glucose-Capillary: 107 mg/dL — ABNORMAL HIGH (ref 70–99)
Glucose-Capillary: 124 mg/dL — ABNORMAL HIGH (ref 70–99)

## 2020-08-20 LAB — COMPREHENSIVE METABOLIC PANEL
ALT: 14 U/L (ref 0–44)
AST: 16 U/L (ref 15–41)
Albumin: 1.2 g/dL — ABNORMAL LOW (ref 3.5–5.0)
Alkaline Phosphatase: 86 U/L (ref 38–126)
Anion gap: 6 (ref 5–15)
BUN: 22 mg/dL — ABNORMAL HIGH (ref 6–20)
CO2: 31 mmol/L (ref 22–32)
Calcium: 8.1 mg/dL — ABNORMAL LOW (ref 8.9–10.3)
Chloride: 99 mmol/L (ref 98–111)
Creatinine, Ser: <0.3 mg/dL — ABNORMAL LOW (ref 0.61–1.24)
Glucose, Bld: 128 mg/dL — ABNORMAL HIGH (ref 70–99)
Potassium: 3.8 mmol/L (ref 3.5–5.1)
Sodium: 136 mmol/L (ref 135–145)
Total Bilirubin: 0.4 mg/dL (ref 0.3–1.2)
Total Protein: 5.5 g/dL — ABNORMAL LOW (ref 6.5–8.1)

## 2020-08-20 LAB — MAGNESIUM: Magnesium: 2 mg/dL (ref 1.7–2.4)

## 2020-08-20 LAB — PHOSPHORUS: Phosphorus: 2.6 mg/dL (ref 2.5–4.6)

## 2020-08-20 MED ORDER — LORAZEPAM 0.5 MG PO TABS
1.0000 mg | ORAL_TABLET | Freq: Every day | ORAL | Status: DC
  Administered 2020-08-20 – 2020-08-23 (×4): 1 mg
  Filled 2020-08-20 (×4): qty 2

## 2020-08-20 NOTE — Progress Notes (Signed)
[facility] for Infectious Disease  Date of Admission:  08/16/2020     Total days of antibiotics 5         ASSESSMENT:  Stuart Woods continues to remain stable and improve. Respiratory cultures have grown multi-drug resistant Pseudomonas with resistance to cefepime, ceftazidime, ciprofloxacin and imipenem. Will send sensitivities for Zyrbaxa, Recarbrio, Avycaz and Cefiderocol. Continue current Zyrbaxa through 12/20 as planned with no further antibiotics after that.  PLAN:  1. Continue Zyrbaxa through 12/20 then stop antibiotics.  2. Check Pseudomonas sensitivities given multi-drug resistance.  3. Continue ventilatory support per primary team.   ID will sign off. Please re-consult if needed.   Active Problems:   Acute respiratory failure (HCC)   Healthcare-associated pneumonia   Sepsis with acute renal failure without septic shock ([place])   Tracheostomy dependent (HCC)   Severe sepsis ([place])   . vitamin C  500 mg Per Tube Daily  . chlorhexidine gluconate (MEDLINE KIT)  15 mL Mouth Rinse BID  . Chlorhexidine Gluconate Cloth  6 each Topical Daily  . collagenase   Topical Daily  . enoxaparin (LOVENOX) injection  40 mg Subcutaneous Q24H  . famotidine  40 mg Per Tube BID  . feeding supplement (PROSource TF)  45 mL Per Tube Daily  . folic acid  2 mg Per Tube Daily  . free water  200 mL Per Tube Q2H  . gabapentin  300 mg Per Tube TID  . LORazepam  1 mg Intravenous QHS  . mouth rinse  15 mL Mouth Rinse QID  . melatonin  9 mg Per Tube QHS  . midodrine  5 mg Per Tube TID WC  . nutrition supplement (JUVEN)  1 packet Per Tube BID  . oxyCODONE  5 mg Per Tube Q6H  . riluzole  50 mg Per Tube Q12H  . sodium chloride flush  10-40 mL Intracatheter Q12H    SUBJECTIVE:  Afebrile overnight with no acute events. No longer on vasopressors and temperature has remained stable.   No Known Allergies   Review of Systems: Review of Systems  Unable to perform ROS: Medical condition       OBJECTIVE: Vitals:   08/20/20 0746 08/20/20 0800 08/20/20 0822 08/20/20 0900  BP:  115/74  (!) 144/89  Pulse:  64  83  Resp:  20  (!) 21  Temp: 98.4 F (36.9 C)     TempSrc: Oral     SpO2:  100% 100% 100%  Weight:      Height:       Body mass index is 19.22 kg/m.  Physical Exam Constitutional:      General: He is not in acute distress.    Appearance: He is well-developed and well-nourished.     Comments: Lying in bed with head of bed elevated.   Cardiovascular:     Rate and Rhythm: Normal rate and regular rhythm.     Pulses: Intact distal pulses.     Heart sounds: Normal heart sounds.  Pulmonary:     Effort: Pulmonary effort is normal.     Breath sounds: Normal breath sounds.  Skin:    General: Skin is warm and dry.  Neurological:     Mental Status: He is alert.  Psychiatric:        Mood and Affect: Mood and affect normal.     Lab Results Lab Results  Component Value Date   WBC 14.6 (H) 08/20/2020   HGB 7.8 (L) 08/20/2020   HCT 25.6 (  L) 08/20/2020   MCV 89.8 08/20/2020   PLT 546 (H) 08/20/2020    Lab Results  Component Value Date   CREATININE <0.30 (L) 08/20/2020   BUN 22 (H) 08/20/2020   NA 136 08/20/2020   K 3.8 08/20/2020   CL 99 08/20/2020   CO2 31 08/20/2020    Lab Results  Component Value Date   ALT 14 08/20/2020   AST 16 08/20/2020   ALKPHOS 86 08/20/2020   BILITOT 0.4 08/20/2020     Microbiology: Recent Results (from the past 240 hour(s))  Blood Culture (routine x 2)     Status: None (Preliminary result)   Collection Time: 08/16/20  1:03 PM   Specimen: BLOOD  Result Value Ref Range Status   Specimen Description BLOOD SITE NOT SPECIFIED  Final   Special Requests   Final    BOTTLES DRAWN AEROBIC AND ANAEROBIC Blood Culture adequate volume   Culture   Final    NO GROWTH 4 DAYS Performed at [facility], [place] [address] [place], [place] [address]    Report Status PENDING  Incomplete  Resp Panel by RT-PCR (Flu A&B, Covid)  Nasopharyngeal Swab     Status: None   Collection Time: 08/16/20  1:03 PM   Specimen: Nasopharyngeal Swab; Nasopharyngeal(NP) swabs in vial transport medium  Result Value Ref Range Status   SARS Coronavirus 2 by RT PCR NEGATIVE NEGATIVE Final    Comment: (NOTE) SARS-CoV-2 target nucleic acids are NOT DETECTED.  The SARS-CoV-2 RNA is generally detectable in upper respiratory specimens during the acute phase of infection. The lowest concentration of SARS-CoV-2 viral copies this assay can detect is 138 copies/mL. A negative result does not preclude SARS-Cov-2 infection and should not be used as the sole basis for treatment or other patient management decisions. A negative result may occur with  improper specimen collection/handling, submission of specimen other than nasopharyngeal swab, presence of viral mutation(s) within the areas targeted by this assay, and inadequate number of viral copies(<138 copies/mL). A negative result must be combined with clinical observations, patient history, and epidemiological information. The expected result is Negative.  Fact Sheet for Patients:  EntrepreneurPulse.com.au  Fact Sheet for Healthcare Providers:  IncredibleEmployment.be  This test is no t yet approved or cleared by the [place] FDA and  has been authorized for detection and/or diagnosis of SARS-CoV-2 by FDA under an Emergency Use Authorization (EUA). This EUA will remain  in effect (meaning this test can be used) for the duration of the COVID-19 declaration under Section 564(b)(1) of the Act, 21 U.S.C.section 360bbb-3(b)(1), unless the authorization is terminated  or revoked sooner.       Influenza A by PCR NEGATIVE NEGATIVE Final   Influenza B by PCR NEGATIVE NEGATIVE Final    Comment: (NOTE) The Xpert Xpress SARS-CoV-2/FLU/RSV plus assay is intended as an aid in the diagnosis of influenza from Nasopharyngeal swab specimens and should not be  used as a sole basis for treatment. Nasal washings and aspirates are unacceptable for Xpert Xpress SARS-CoV-2/FLU/RSV testing.  Fact Sheet for Patients: EntrepreneurPulse.com.au  Fact Sheet for Healthcare Providers: IncredibleEmployment.be  This test is not yet approved or cleared by the [place] FDA and has been authorized for detection and/or diagnosis of SARS-CoV-2 by FDA under an Emergency Use Authorization (EUA). This EUA will remain in effect (meaning this test can be used) for the duration of the COVID-19 declaration under Section 564(b)(1) of the Act, 21 U.S.C. section 360bbb-3(b)(1), unless the authorization is terminated  or revoked.  Performed at [facility], [place] [address], [place], [place] [address]   Urine culture     Status: Abnormal   Collection Time: 08/16/20  2:15 PM   Specimen: In/Out Cath Urine  Result Value Ref Range Status   Specimen Description IN/OUT CATH URINE  Final   Special Requests   Final    NONE Performed at [facility], [place] [address], [place], [place] [address]    Culture MULTIPLE SPECIES PRESENT, SUGGEST RECOLLECTION (A)  Final   Report Status 08/17/2020 FINAL  Final  MRSA PCR Screening     Status: None   Collection Time: 08/16/20  9:21 PM   Specimen: Nasal Mucosa; Nasopharyngeal  Result Value Ref Range Status   MRSA by PCR NEGATIVE NEGATIVE Final    Comment:        The GeneXpert MRSA Assay (FDA approved for NASAL specimens only), is one component of a comprehensive MRSA colonization surveillance program. It is not intended to diagnose MRSA infection nor to guide or monitor treatment for MRSA infections. Performed at [facility], [place] [address], [place], [place] [address]   Blood Culture (routine x 2)     Status: None (Preliminary result)   Collection Time: 08/16/20 11:12 PM   Specimen: BLOOD RIGHT HAND  Result Value Ref Range Status   Specimen Description BLOOD RIGHT  HAND  Final   Special Requests   Final    BOTTLES DRAWN AEROBIC AND ANAEROBIC Blood Culture adequate volume   Culture   Final    NO GROWTH 3 DAYS Performed at [facility], [place] [address], [place], [place] [address]    Report Status PENDING  Incomplete  Culture, respiratory (non-expectorated)     Status: None   Collection Time: 08/17/20  4:39 PM   Specimen: Tracheal Aspirate; Respiratory  Result Value Ref Range Status   Specimen Description TRACHEAL ASPIRATE  Final   Special Requests NONE  Final   Gram Stain   Final    RARE WBC PRESENT,BOTH PMN AND MONONUCLEAR FEW GRAM POSITIVE COCCI Performed at [facility], [place] [address], [place], [place] [address]    Culture   Final    FEW PSEUDOMONAS AERUGINOSA MULTI-DRUG RESISTANT ORGANISM    Report Status 08/20/2020 FINAL  Final   Organism ID, Bacteria PSEUDOMONAS AERUGINOSA  Final      Susceptibility   Pseudomonas aeruginosa - MIC*    CEFTAZIDIME >=64 RESISTANT Resistant     CIPROFLOXACIN >=4 RESISTANT Resistant     GENTAMICIN 4 SENSITIVE Sensitive     IMIPENEM >=16 RESISTANT Resistant     CEFEPIME >=32 RESISTANT Resistant     * FEW PSEUDOMONAS AERUGINOSA     Terri Piedra, NP [facility] Infectious [facility]  08/20/2020  10:11 AM

## 2020-08-20 NOTE — Progress Notes (Signed)
Assisted tele visit to patient with family member.  Candace Begue D Marieanne Marxen, RN   

## 2020-08-20 NOTE — Progress Notes (Addendum)
CSW spoke with Mya, LPN at [facility] who states the pharmacy cannot provide the patient with the IV antibiotics he is currently on. [facility] to accept the patient back after he completes his IV antibiotics.  CSW informed Dr. Celine Mans of information.  Edwin Dada, MSW, LCSW-A Transitions of Care  Clinical Social Worker I [facility] Emergency Departments  Medical ICU [phone]

## 2020-08-20 NOTE — Progress Notes (Addendum)
Physical Therapy Wound Treatment Patient Details  Name: Stuart Woods MRN: [id number] Date of Birth: 06/17/1966  Today's Date: 08/20/2020 Time: 9509-3267 Time Calculation (min): 37 min  Subjective  Subjective: Awake and alert on vent Patient and Family Stated Goals: heal wounds; see improvement Date of Onset:  (Unknown) Prior Treatments: Dressing changes  Pain Score:  premedicated with Fentanyl, pain 2/10  Wound Assessment     Pressure Injury 02/09/20 Coccyx Left Unstageable - Full thickness tissue loss in which the base of the injury is covered by slough (yellow, tan, gray, green or brown) and/or eschar (tan, brown or black) in the wound bed. 08/17/20 staging update at the ti (Active)  Wound Image   08/17/20 1420  Dressing Type Barrier Film (skin prep);ABD;Moist to dry;Normal saline moist dressing 08/20/20 1511  Dressing Changed 08/20/20 1511  Dressing Change Frequency Daily 08/20/20 1511  State of Healing Eschar 08/20/20 1511  Site / Wound Assessment Dressing in place / Unable to assess 08/19/20 2000  % Wound base Red or Granulating 40% 08/20/20 1511  % Wound base Yellow/Fibrinous Exudate 50% 08/20/20 1511  % Wound base Black/Eschar 10% 08/20/20 1511  % Wound base Other/Granulation Tissue (Comment) 0% 08/19/20 1240  Peri-wound Assessment Intact;Excoriated 08/20/20 1511  Wound Length (cm) 4.5 cm 08/17/20 1420  Wound Width (cm) 4 cm 08/17/20 1420  Wound Depth (cm) 0.1 cm 08/17/20 1420  Wound Surface Area (cm^2) 18 cm^2 08/17/20 1420  Wound Volume (cm^3) 1.8 cm^3 08/17/20 1420  Tunneling (cm) 0 08/17/20 1420  Undermining (cm) 0 08/17/20 1420  Margins Unattached edges (unapproximated) 08/19/20 1240  Drainage Amount Minimal 08/20/20 1511  Drainage Description Serosanguineous 08/19/20 1240  Treatment Cleansed;Debridement (Selective);Hydrotherapy (Pulse lavage);Packing (Saline gauze) 08/20/20 1511   Santyl applied to wound bed prior to applying dressing.      Pressure Injury  08/17/20 Ischial tuberosity Left Unstageable - Full thickness tissue loss in which the base of the injury is covered by slough (yellow, tan, gray, green or brown) and/or eschar (tan, brown or black) in the wound bed. (Active)  Wound Image   08/17/20 1420  Dressing Type Barrier Film (skin prep);Gauze (Comment);Moist to dry;Normal saline moist dressing 08/20/20 1511  Dressing Changed;Clean;Dry 08/20/20 1511  Dressing Change Frequency Daily 08/20/20 1511  State of Healing Eschar 08/20/20 1511  Site / Wound Assessment Clean;Bleeding;Pale;Pink;Yellow 08/20/20 1511  % Wound base Red or Granulating 25% 08/20/20 1511  % Wound base Yellow/Fibrinous Exudate 50% 08/20/20 1511  % Wound base Black/Eschar 25% 08/20/20 1511  % Wound base Other/Granulation Tissue (Comment) 0% 08/19/20 1240  Peri-wound Assessment Intact 08/20/20 1511  Wound Length (cm) 4 cm 08/17/20 1420  Wound Width (cm) 4 cm 08/17/20 1420  Wound Depth (cm) 1.2 cm 08/17/20 1420  Wound Surface Area (cm^2) 16 cm^2 08/17/20 1420  Wound Volume (cm^3) 19.2 cm^3 08/17/20 1420  Undermining (cm) 3.2 cm from 10:00-12:00 08/17/20 1420  Margins Unattached edges (unapproximated) 08/19/20 1240  Drainage Amount Minimal 08/20/20 1511  Drainage Description Serosanguineous 08/19/20 1240  Treatment Cleansed;Debridement (Selective);Hydrotherapy (Pulse lavage);Packing (Saline gauze);Other (Comment) 08/20/20 1511   Hydrotherapy Pulsed lavage therapy - wound location: ischial tuberosity and sacrum Pulsed Lavage with Suction (psi): 8 psi Pulsed Lavage with Suction - Normal Saline Used: 1000 mL Pulsed Lavage Tip: Tip with splash shield Selective Debridement Selective Debridement - Location: ischial tuberosity and sacrum Selective Debridement - Tools Used: Forceps;Scalpel Selective Debridement - Tissue Removed: eschar and yellow slough   Wound Assessment and Plan  Wound Therapy - Assess/Plan/Recommendations Wound Therapy - Clinical  Statement: Progressing  with debridement. Ischial tuberosity wound with improved wound bed appearance and debrided away the slough from the superior undermined area.   Sacral wound necrotic tissue is now decreased. This patient will benefit from continued hydrotherapy for selective removal of unviable tissue, to decrease bioburden and promote wound bed healing. Wound Therapy - Functional Problem List: Global weakness due to ALS Factors Delaying/Impairing Wound Healing: Immobility;Multiple medical problems Hydrotherapy Plan: Debridement;Dressing change;Pulsatile lavage with suction;Patient/family education Wound Therapy - Frequency: 6X / week Wound Therapy - Follow Up Recommendations: Other (comment) (LTACH) Wound Plan: See above  Wound Therapy Goals- Improve the function of patient's integumentary system by progressing the wound(s) through the phases of wound healing (inflammation - proliferation - remodeling) by: Decrease Necrotic Tissue to: 20% Decrease Necrotic Tissue - Progress: Progressing toward goal Increase Granulation Tissue to: 80% Increase Granulation Tissue - Progress: Progressing toward goal Goals/treatment plan/discharge plan were made with and agreed upon by patient/family: No, Patient unable to participate in goals/treatment/discharge plan and family unavailable Time For Goal Achievement: 7 days Wound Therapy - Potential for Goals: Good  Goals will be updated until maximal potential achieved or discharge criteria met.  Discharge criteria: when goals achieved, discharge from hospital, MD decision/surgical intervention, no progress towards goals, refusal/missing three consecutive treatments without notification or medical reason.  GP     Tessie Fass Jin Shockley 08/20/2020, 3:19 PM 08/20/2020  Ginger Carne., PT Acute Rehabilitation Services [phone]  (pager) [phone]  (office)

## 2020-08-20 NOTE — Progress Notes (Signed)
NAME:  Stuart Woods, MRN:  [id number], DOB:  Jul 28, 1966, LOS: 4 ADMISSION DATE:  08/16/2020, CONSULTATION DATE:  08/16/20 REFERRING MD: Rhunette Croft , CHIEF COMPLAINT:  AMS   Brief History   Stuart Woods is a 54 yo M w/ PMH of ALS trach & vent dependent who presents to [facility] from [facility] due to encephalopathy. PCCM consulted for respiratory distress  History of present illness   Stuart Woods is a 54 yo M w/ PMH of ALS. History obtained via chart review. He was in his usual state of health until last Thursday when he was noted to be febrile, diaphoretic and altered. Chest X-ray at the faiclity showed RLL pneumonia and he was started on IV gentamicin. Despite the treatment, he did not have clinical improvement and he was brought to [facility] for evaluation.  Chart review shows that at baseline, he is able to communicate with nodding and shaking his head and follow directions.  Past Medical History  ALS, Trach & vent dependence  Significant Hospital Events   08/16/20 admitted for HAP in the setting of chronic respiratory failure secondary to ALS. Septic shock on levophed.  12/14 ID consulted and adjusted antibiotics to Zyvox and Recarbrio  12/15 Midodrine restarted, Levo off. Low tidal volumes on wean.  12/16: sputum culture growing pseudomonas.   Consults:  Infectious disease  Procedures:   Significant Diagnostic Tests:  12/13 IMPRESSION: Tube and catheter positions as described without pneumothorax. Patchy bibasilar airspace opacity, more on the left than on the right. Suspect multifocal pneumonia, although aspiration could present similarly. Both aspiration and pneumonia may present concurrently. Heart size normal.  Micro Data:  08/16/20 COVID /Flu neg 08/16/20 Blood culture >> 08/16/20 Urine culture >>  Antimicrobials:  12/13 Vancomycin>12/14 12/13 Cefepime > 12/14 12/13 Flagyl >12/13 Recarbrio 12/14 Zyvox 12/14 >12/16 Caftolozane-tazobactam 12/15 >  Interim  history/subjective:  No acute events overnight.   Objective   Blood pressure 119/78, pulse 83, temperature (!) 97.5 F (36.4 C), temperature source Oral, resp. rate (!) 21, height 5\' 11"  (1.803 m), weight 62.5 kg, SpO2 100 %.    Vent Mode: PRVC FiO2 (%):  [40 %-60 %] 50 % Set Rate:  [21 bmp] 21 bmp Vt Set:  [500 mL] 500 mL PEEP:  [5 cmH20] 5 cmH20 Plateau Pressure:  [21 cmH20-27 cmH20] 23 cmH20   Intake/Output Summary (Last 24 hours) at 08/20/2020 0728 Last data filed at 08/20/2020 0600 Gross per 24 hour  Intake 3555.22 ml  Output 3550 ml  Net 5.22 ml   Filed Weights   08/17/20 0500 08/19/20 0445 08/20/20 0249  Weight: 51.8 kg 60 kg 62.5 kg    Examination:  Gen: frail middle aged male on vent via trach HEENT: [place]/AT, Trach in place, minimal site drainage. PERRL CV: RRR, S1, S2 normal, No rubs, no murmurs, no gallops Pulm: Clear vent assisted breath sounds Abd: bowel sounds normoactive Extm: No edema.  Skin: Sacral, ischial pressure wounds.  Neuro: Awake, alert, tracks.   Resolved Hospital Problem list     Assessment & Plan:   Acute on chronic hypoxic respiratory failure secondary to HCAP  Present with AMS after failing outpatient abx therapy with gentamicin. Febrile, tachypneic and X-ray w/ bibasilar opacities. COVID negative.  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 Sputum cultures showing pseudomonas resistant to ceftaz, cipro, imipenem, cefepime, sensitive to gentamycin.  P: - Zyrbaxa per ID through 12/20. Appreciate their assistance.  - ID sending additional sensitivities out for evaluation.   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  - Full vent support - Bronchial hygiene - Has consistently low tidal volumes on wean.  - Riluzole 50mg  BID - Oxycodone for pain  Acute Metabolic  Encephalopathy: improving: Likely secondary to sepsis and sedating medications - Supportive care  Hypovolemic Hypernatremia: improved Hypokalemia P: - TF and Free water - Trend bmp, Mg, phos - K replaced  Chronic pressure ulcers Noted multiple ulcers on exam.  - Wound care consulting  Goals of care: Full code aggressive measures. Patient has strict Jewish beliefs and has certain requirements should he pass while in the hospital. Please refer to letter provided by his wife, , should this unfortunate event take place. Notably, his body is not to be washed and call the funeral home immediately as he requires burial within 24 hours.   Ready for return to [facility] medically, however, [facility] unable to provide Zyrbaxa. Will plan to finish course here with tentative return to [facility] 12/21.  Best practice (evaluated daily)   Diet: NPO, TF Pain/Anxiety/Delirium protocol (if indicated):Oxycodone. PRN Fentanyl VAP protocol (if indicated): per protocol DVT prophylaxis: lovenox GI prophylaxis: Famotidine Glucose control: SSI Mobility: BR last date of multidisciplinary goals of care discussion: due 12/20 Family and staff present  Summary of discussion: Follow up goals of care discussion due: Family update Wife 1/21 updated at bedside.    Code Status: Full Disposition: ICU  Labs   CBC: Recent Labs  Lab 08/16/20 1303 08/16/20 1731 08/17/20 0216 08/17/20 0352 08/17/20 1130 08/18/20 0352 08/19/20 0326 08/20/20 0544  WBC 16.3*  --  13.8*  --  12.3* 11.9* 13.8* 14.6*  NEUTROABS 13.7*  --   --   --   --   --   --   --   HGB 10.3*   < > 9.6* 8.8* 7.8* 7.5* 7.7* 7.8*  HCT 36.5*   < > 32.5* 26.0* 27.8* 26.2* 24.4* 25.6*  MCV 95.5  --  92.9  --  94.9 93.6 88.7 89.8  PLT 687*  --  499*  --  460* 565* 543* 546*   < > = values in this interval not displayed.    Basic Metabolic Panel: Recent Labs  Lab 08/16/20 2257 08/16/20 2319 08/17/20 0216 08/17/20 0352 08/17/20 1018  08/18/20 0352 08/19/20 0326 08/20/20 0544  NA  --    < > 152* 151* 152* 143 143 136  K  --    < > 3.4* 3.0* 3.8 3.2* 3.6 3.8  CL  --   --  107  --  107 104 104 99  CO2  --   --  34*  --  31 30 31 31   GLUCOSE  --   --  114*  --  102* 128* 161* 128*  BUN  --   --  38*  --  27* 28* 22* 22*  CREATININE  --   --  <0.30*  --  <0.30* <0.30* <0.30* <0.30*  CALCIUM  --   --  9.2  --  9.4 8.3* 8.3* 8.1*  MG 2.0  --  1.9  --   --   --  1.7 2.0  PHOS 2.0*  --  2.1*  --   --   --  2.8 2.6   < > = values in this interval not displayed.   GFR: CrCl cannot be calculated (This lab value cannot be used to calculate CrCl  because it is not a number: <0.30). Recent Labs  Lab 08/16/20 1303 08/16/20 2257 08/17/20 0216 08/17/20 1018 08/17/20 1130 08/18/20 0352 08/18/20 0405 08/19/20 0326 08/20/20 0544  PROCALCITON 0.97  --   --   --   --   --   --   --   --   WBC 16.3*  --  13.8*  --  12.3* 11.9*  --  13.8* 14.6*  LATICACIDVEN 1.6 3.0* 3.3* 3.1*  --   --  1.6  --   --     Liver Function Tests: Recent Labs  Lab 08/16/20 1303 08/17/20 0216 08/20/20 0544  AST 20 19 16   ALT 13 12 14   ALKPHOS 90 82 86  BILITOT 0.4 0.6 0.4  PROT 7.2 6.2* 5.5*  ALBUMIN 1.6* 1.4* 1.2*   No results for input(s): LIPASE, AMYLASE in the last 168 hours. No results for input(s): AMMONIA in the last 168 hours.  ABG    Component Value Date/Time   PHART 7.449 08/17/2020 0352   PCO2ART 54.5 (H) 08/17/2020 0352   PO2ART 73 (L) 08/17/2020 0352   HCO3 38.2 (H) 08/17/2020 0352   TCO2 40 (H) 08/17/2020 0352   ACIDBASEDEF 1.0 02/09/2020 0451   O2SAT 95.0 08/17/2020 0352     Coagulation Profile: Recent Labs  Lab 08/16/20 1303  INR 1.3*    Cardiac Enzymes: No results for input(s): CKTOTAL, CKMB, CKMBINDEX, TROPONINI in the last 168 hours.  HbA1C: Hgb A1c MFr Bld  Date/Time Value Ref Range Status  02/09/2020 02:48 AM 5.3 4.8 - 5.6 % Final    Comment:    (NOTE) Pre diabetes:          5.7%-6.4% Diabetes:               >6.4% Glycemic control for   <7.0% adults with diabetes     CBG: Recent Labs  Lab 08/19/20 1104 08/19/20 1531 08/19/20 1934 08/19/20 2328 08/20/20 0324  GLUCAP 91 94 106* 73 75     Critical care time: 35 mins     08/21/20, AGACNP-BC [facility]  See Amion for personal pager PCCM on call pager [phone]  08/20/2020 7:28 AM

## 2020-08-21 DIAGNOSIS — J9611 Chronic respiratory failure with hypoxia: Secondary | ICD-10-CM

## 2020-08-21 DIAGNOSIS — J9612 Chronic respiratory failure with hypercapnia: Secondary | ICD-10-CM

## 2020-08-21 LAB — GLUCOSE, CAPILLARY
Glucose-Capillary: 104 mg/dL — ABNORMAL HIGH (ref 70–99)
Glucose-Capillary: 104 mg/dL — ABNORMAL HIGH (ref 70–99)
Glucose-Capillary: 108 mg/dL — ABNORMAL HIGH (ref 70–99)
Glucose-Capillary: 109 mg/dL — ABNORMAL HIGH (ref 70–99)
Glucose-Capillary: 138 mg/dL — ABNORMAL HIGH (ref 70–99)
Glucose-Capillary: 75 mg/dL (ref 70–99)
Glucose-Capillary: 84 mg/dL (ref 70–99)

## 2020-08-21 LAB — CBC WITH DIFFERENTIAL/PLATELET
Abs Immature Granulocytes: 0.34 10*3/uL — ABNORMAL HIGH (ref 0.00–0.07)
Basophils Absolute: 0 10*3/uL (ref 0.0–0.1)
Basophils Relative: 0 %
Eosinophils Absolute: 0.3 10*3/uL (ref 0.0–0.5)
Eosinophils Relative: 1 %
HCT: 25.7 % — ABNORMAL LOW (ref 39.0–52.0)
Hemoglobin: 7.8 g/dL — ABNORMAL LOW (ref 13.0–17.0)
Immature Granulocytes: 2 %
Lymphocytes Relative: 9 %
Lymphs Abs: 2 10*3/uL (ref 0.7–4.0)
MCH: 27.2 pg (ref 26.0–34.0)
MCHC: 30.4 g/dL (ref 30.0–36.0)
MCV: 89.5 fL (ref 80.0–100.0)
Monocytes Absolute: 0.8 10*3/uL (ref 0.1–1.0)
Monocytes Relative: 4 %
Neutro Abs: 17.5 10*3/uL — ABNORMAL HIGH (ref 1.7–7.7)
Neutrophils Relative %: 84 %
Platelets: 614 10*3/uL — ABNORMAL HIGH (ref 150–400)
RBC: 2.87 MIL/uL — ABNORMAL LOW (ref 4.22–5.81)
RDW: 15.4 % (ref 11.5–15.5)
WBC: 21 10*3/uL — ABNORMAL HIGH (ref 4.0–10.5)
nRBC: 0 % (ref 0.0–0.2)

## 2020-08-21 LAB — COMPREHENSIVE METABOLIC PANEL
ALT: 14 U/L (ref 0–44)
AST: 15 U/L (ref 15–41)
Albumin: 1.2 g/dL — ABNORMAL LOW (ref 3.5–5.0)
Alkaline Phosphatase: 93 U/L (ref 38–126)
Anion gap: 7 (ref 5–15)
BUN: 20 mg/dL (ref 6–20)
CO2: 33 mmol/L — ABNORMAL HIGH (ref 22–32)
Calcium: 8.1 mg/dL — ABNORMAL LOW (ref 8.9–10.3)
Chloride: 97 mmol/L — ABNORMAL LOW (ref 98–111)
Creatinine, Ser: <0.3 mg/dL — ABNORMAL LOW (ref 0.61–1.24)
Glucose, Bld: 119 mg/dL — ABNORMAL HIGH (ref 70–99)
Potassium: 3.8 mmol/L (ref 3.5–5.1)
Sodium: 137 mmol/L (ref 135–145)
Total Bilirubin: 0.3 mg/dL (ref 0.3–1.2)
Total Protein: 5.6 g/dL — ABNORMAL LOW (ref 6.5–8.1)

## 2020-08-21 LAB — CULTURE, BLOOD (ROUTINE X 2)
Culture: NO GROWTH
Special Requests: ADEQUATE

## 2020-08-21 LAB — MAGNESIUM: Magnesium: 2.1 mg/dL (ref 1.7–2.4)

## 2020-08-21 MED ORDER — HYDROMORPHONE HCL 1 MG/ML IJ SOLN
1.0000 mg | Freq: Once | INTRAMUSCULAR | Status: AC
  Administered 2020-08-22: 1 mg via INTRAVENOUS
  Filled 2020-08-21 (×2): qty 1

## 2020-08-21 NOTE — Progress Notes (Signed)
eLink Physician-Brief Progress Note Patient Name: Stuart Woods DOB: 1965/10/28 MRN: [id number]   Date of Service  08/21/2020  HPI/Events of Note  Patient needs a.m. labs ordered.  eICU Interventions  Labs ordered.        Thomasene Lot Calandra Madura 08/21/2020, 3:12 AM

## 2020-08-21 NOTE — Progress Notes (Signed)
NAME:  Stuart Woods, MRN:  [id number], DOB:  [birth year], LOS: 5 ADMISSION DATE:  08/16/2020, CONSULTATION DATE:  08/16/20 REFERRING MD: Rhunette Croft , CHIEF COMPLAINT:  AMS   Brief History   Stuart Woods is a 54 yo M w/ PMH of ALS trach & vent dependent who presents to [facility] from [facility] due to encephalopathy. PCCM consulted for respiratory distress  History of present illness   Stuart Woods is a 54 yo M w/ PMH of ALS. History obtained via chart review. He was in his usual state of health until last Thursday when he was noted to be febrile, diaphoretic and altered. Chest X-ray at the faiclity showed RLL pneumonia and he was started on IV gentamicin. Despite the treatment, he did not have clinical improvement and he was brought to [facility] for evaluation.  Chart review shows that at baseline, he is able to communicate with nodding and shaking his head and follow directions.  Past Medical History  ALS, Trach & vent dependence  Significant Hospital Events   08/16/20 admitted for HAP in the setting of chronic respiratory failure secondary to ALS. Septic shock on levophed.  12/14 ID consulted and adjusted antibiotics to Zyvox and Recarbrio  12/15 Midodrine restarted, Levo off. Low tidal volumes on wean.  12/16: sputum culture growing pseudomonas.   Consults:  Infectious disease  Procedures:   Significant Diagnostic Tests:  12/13 IMPRESSION: Tube and catheter positions as described without pneumothorax. Patchy bibasilar airspace opacity, more on the left than on the right. Suspect multifocal pneumonia, although aspiration could present similarly. Both aspiration and pneumonia may present concurrently. Heart size normal.  Micro Data:  08/16/20 COVID /Flu neg 08/16/20 Blood culture >> 08/16/20 Urine culture >>  Antimicrobials:  12/13 Vancomycin>12/14 12/13 Cefepime > 12/14 12/13 Flagyl >12/13 Recarbrio 12/14 Zyvox 12/14 >12/16 Caftolozane-tazobactam 12/15 >  Interim  history/subjective:  No acute events overnight. Received hydrotherapy yesterday.   Objective   Blood pressure 114/64, pulse 81, temperature 98.8 F (37.1 C), temperature source Axillary, resp. rate (!) 21, height 5\' 11"  (1.803 m), weight 62.1 kg, SpO2 100 %.    Vent Mode: PRVC FiO2 (%):  [40 %-50 %] 40 % Set Rate:  [21 bmp] 21 bmp Vt Set:  [500 mL] 500 mL PEEP:  [5 cmH20] 5 cmH20 Plateau Pressure:  [24 cmH20-29 cmH20] 24 cmH20   Intake/Output Summary (Last 24 hours) at 08/21/2020 0914 Last data filed at 08/21/2020 0800 Gross per 24 hour  Intake 4902.42 ml  Output 4925 ml  Net -22.58 ml   Filed Weights   08/19/20 0445 08/20/20 0249 08/21/20 0500  Weight: 60 kg 62.5 kg 62.1 kg    Examination:  Gen: frail middle aged male on vent via trach HEENT: Stuart Woods/AT, Trach in place, minimal site drainage. PERRL CV: RRR, S1, S2 normal, No rubs, no murmurs, no gallops Pulm: Clear vent assisted breath sounds Abd: bowel sounds normoactive Extm: No edema.  Skin: Sacral, ischial pressure wounds.  Neuro: Awake, alert, tracks.   Resolved Hospital Problem list     Assessment & Plan:   Acute on chronic hypoxic respiratory failure secondary to HCAP  Present with AMS after failing outpatient abx therapy with gentamicin. Febrile, tachypneic and X-ray w/ bibasilar opacities. COVID negative.  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 Sputum cultures showing pseudomonas resistant to ceftaz, cipro, imipenem, cefepime, sensitive to gentamycin.  P: - Zyrbaxa per ID through 12/20. Appreciate their assistance.  - ID sending additional sensitivities out for evaluation.   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  - Full vent support - Bronchial hygiene - Has consistently low tidal volumes on wean.  - Riluzole 50mg  BID - Oxycodone  for pain  Acute Metabolic Encephalopathy: improving: Likely secondary to sepsis and sedating medications - Supportive care  Hypovolemic Hypernatremia: improved Hypokalemia P: - TF and Free water - Trend bmp, Mg, phos - K replaced  Chronic pressure ulcers Noted multiple ulcers on exam.  - Wound care consulting  Goals of care: Full code aggressive measures. Patient has strict Jewish beliefs and has certain requirements should he pass while in the hospital. Please refer to letter provided by his wife, , should this unfortunate event take place. Notably, his body is not to be washed and call the funeral home immediately as he requires burial within 24 hours.   Ready for return to [facility] medically, however, [facility] unable to provide Zyrbaxa. Will plan to finish course here with tentative return to [facility] 12/21.  Best practice (evaluated daily)   Diet: NPO, TF at goal.  Pain/Anxiety/Delirium protocol (if indicated):Oxycodone. PRN Fentanyl VAP protocol (if indicated): per protocol DVT prophylaxis: lovenox GI prophylaxis: Famotidine Glucose control: blood sugars controlled.  Mobility: BR last date of multidisciplinary goals of care discussion: due 12/20 Family and staff present  Summary of discussion: Follow up goals of care discussion due: Family update Wife 1/21 updated at bedside.    Code Status: Full Disposition: ICU  The patient is critically ill with multiple organ systems failure and requires high complexity decision making for assessment and support, frequent evaluation and titration of therapies, application of advanced monitoring technologies and extensive interpretation of multiple databases.   Critical Care Time devoted to patient care services described in this note is 31 minutes. This time reflects time of care of this signee Irving Burton . This critical care time does not reflect separately billable procedures or procedure time, teaching time or supervisory time  of PA/NP/Med student/Med Resident etc but could involve care discussion time.  Charlott Holler [facility] 08/21/2020 9:15 AM  Pager: [phone] After hours pager: [phone]   Labs   CBC: Recent Labs  Lab 08/16/20 1303 08/16/20 1731 08/17/20 1130 08/18/20 0352 08/19/20 0326 08/20/20 0544 08/21/20 0307  WBC 16.3*   < > 12.3* 11.9* 13.8* 14.6* 21.0*  NEUTROABS 13.7*  --   --   --   --   --  17.5*  HGB 10.3*   < > 7.8* 7.5* 7.7* 7.8* 7.8*  HCT 36.5*   < > 27.8* 26.2* 24.4* 25.6* 25.7*  MCV 95.5   < > 94.9 93.6 88.7 89.8 89.5  PLT 687*   < > 460* 565* 543* 546* 614*   < > = values in this interval not displayed.    Basic Metabolic Panel: Recent Labs  Lab 08/16/20 2257 08/16/20 2319 08/17/20 0216 08/17/20 0352 08/17/20 1018 08/18/20 0352 08/19/20 0326 08/20/20 0544 08/21/20 0307  NA  --    < > 152*   < > 152* 143 143 136 137  K  --    < > 3.4*   < > 3.8 3.2* 3.6 3.8 3.8  CL  --   --  107  --  107 104 104 99 97*  CO2  --   --  34*  --  31 30 31 31  33*  GLUCOSE  --   --  114*  --  102* 128* 161* 128* 119*  BUN  --   --  38*  --  27* 28* 22* 22* 20  CREATININE  --   --  <0.30*  --  <0.30* <0.30* <0.30* <0.30* <0.30*  CALCIUM  --   --  9.2  --  9.4 8.3* 8.3* 8.1* 8.1*  MG 2.0  --  1.9  --   --   --  1.7 2.0 2.1  PHOS 2.0*  --  2.1*  --   --   --  2.8 2.6  --    < > = values in this interval not displayed.   GFR: CrCl cannot be calculated (This lab value cannot be used to calculate CrCl because it is not a number: <0.30). Recent Labs  Lab 08/16/20 1303 08/16/20 2257 08/17/20 0216 08/17/20 1018 08/17/20 1130 08/18/20 0352 08/18/20 0405 08/19/20 0326 08/20/20 0544 08/21/20 0307  PROCALCITON 0.97  --   --   --   --   --   --   --   --   --   WBC 16.3*  --  13.8*  --    < > 11.9*  --  13.8* 14.6* 21.0*  LATICACIDVEN 1.6 3.0* 3.3* 3.1*  --   --  1.6  --   --   --    < > = values in this interval not displayed.    Liver  Function Tests: Recent Labs  Lab 08/16/20 1303 08/17/20 0216 08/20/20 0544 08/21/20 0307  AST 20 19 16 15   ALT 13 12 14 14   ALKPHOS 90 82 86 93  BILITOT 0.4 0.6 0.4 0.3  PROT 7.2 6.2* 5.5* 5.6*  ALBUMIN 1.6* 1.4* 1.2* 1.2*   No results for input(s): LIPASE, AMYLASE in the last 168 hours. No results for input(s): AMMONIA in the last 168 hours.  ABG    Component Value Date/Time   PHART 7.449 08/17/2020 0352   PCO2ART 54.5 (H) 08/17/2020 0352   PO2ART 73 (L) 08/17/2020 0352   HCO3 38.2 (H) 08/17/2020 0352   TCO2 40 (H) 08/17/2020 0352   ACIDBASEDEF 1.0 02/09/2020 0451   O2SAT 95.0 08/17/2020 0352     Coagulation Profile: Recent Labs  Lab 08/16/20 1303  INR 1.3*    Cardiac Enzymes: No results for input(s): CKTOTAL, CKMB, CKMBINDEX, TROPONINI in the last 168 hours.  HbA1C: Hgb A1c MFr Bld  Date/Time Value Ref Range Status  02/09/2020 02:48 AM 5.3 4.8 - 5.6 % Final    Comment:    (NOTE) Pre diabetes:          5.7%-6.4% Diabetes:              >6.4% Glycemic control for   <7.0% adults with diabetes     CBG: Recent Labs  Lab 08/20/20 1559 08/20/20 1946 08/21/20 0034 08/21/20 0402 08/21/20 0805  GLUCAP 107* 124* 75 84 104*

## 2020-08-21 NOTE — Progress Notes (Signed)
Physical Therapy Wound Treatment Patient Details  Name: Stuart Woods MRN: [id number] Date of Birth: Jan 20, 1966  Today's Date: 08/21/2020 Time: 0925-1007 Time Calculation (min): 42 min  Subjective  Subjective: Awake and alert on vent Patient and Family Stated Goals: heal wounds; see improvement Date of Onset:  (Unknown) Prior Treatments: Dressing changes  Pain Score:  4/10 (premedicated)  Wound Assessment  Pressure Injury 02/09/20 Coccyx Left Unstageable - Full thickness tissue loss in which the base of the injury is covered by slough (yellow, tan, gray, green or brown) and/or eschar (tan, brown or black) in the wound bed. 08/17/20 staging update at the ti (Active)  Dressing Type ABD;Barrier Film (skin prep);Gauze (Comment);Moist to moist 08/21/20 1033  Dressing Changed;Clean;Dry;Intact 08/21/20 1033  Dressing Change Frequency Daily 08/21/20 1033  State of Healing Eschar 08/21/20 1033  Site / Wound Assessment Dressing in place / Unable to assess 08/20/20 2000  % Wound base Red or Granulating 40% 08/20/20 1511  % Wound base Yellow/Fibrinous Exudate 50% 08/20/20 1511  % Wound base Black/Eschar 10% 08/20/20 1511  % Wound base Other/Granulation Tissue (Comment) 0% 08/19/20 1240  Peri-wound Assessment Intact;Excoriated 08/21/20 1033  Wound Length (cm) 4.5 cm 08/17/20 1420  Wound Width (cm) 4 cm 08/17/20 1420  Wound Depth (cm) 0.1 cm 08/17/20 1420  Wound Surface Area (cm^2) 18 cm^2 08/17/20 1420  Wound Volume (cm^3) 1.8 cm^3 08/17/20 1420  Tunneling (cm) 0 08/17/20 1420  Undermining (cm) 0 08/17/20 1420  Margins Unattached edges (unapproximated) 08/21/20 1033  Drainage Amount Minimal 08/21/20 1033  Drainage Description Serosanguineous 08/21/20 1033  Treatment Debridement (Selective);Hydrotherapy (Pulse lavage);Packing (Saline gauze) 08/21/20 1033     Pressure Injury 08/17/20 Ischial tuberosity Left Unstageable - Full thickness tissue loss in which the base of the injury is covered  by slough (yellow, tan, gray, green or brown) and/or eschar (tan, brown or black) in the wound bed. (Active)  Dressing Type ABD;Barrier Film (skin prep);Gauze (Comment);Moist to moist 08/21/20 1033  Dressing Changed;Dry;Clean;Intact 08/21/20 1033  Dressing Change Frequency Daily 08/21/20 1033  State of Healing Early/partial granulation 08/21/20 1033  Site / Wound Assessment Bleeding;Pink;Red;Yellow 08/21/20 1033  % Wound base Red or Granulating 25% 08/20/20 1511  % Wound base Yellow/Fibrinous Exudate 50% 08/20/20 1511  % Wound base Black/Eschar 25% 08/20/20 1511  % Wound base Other/Granulation Tissue (Comment) 0% 08/19/20 1240  Peri-wound Assessment Intact 08/21/20 1033  Wound Length (cm) 4 cm 08/17/20 1420  Wound Width (cm) 4 cm 08/17/20 1420  Wound Depth (cm) 1.2 cm 08/17/20 1420  Wound Surface Area (cm^2) 16 cm^2 08/17/20 1420  Wound Volume (cm^3) 19.2 cm^3 08/17/20 1420  Undermining (cm) 3.2 cm from 10:00-12:00 08/17/20 1420  Margins Unattached edges (unapproximated) 08/21/20 1033  Drainage Amount Moderate 08/21/20 1033  Drainage Description Sanguineous 08/21/20 1033  Treatment Debridement (Selective);Hydrotherapy (Pulse lavage);Packing (Saline gauze) 08/21/20 1033  Santyl applied to wound bed prior to applying dressing.   Hydrotherapy Pulsed lavage therapy - wound location: ischial tuberosity and sacrum Pulsed Lavage with Suction (psi): 8 psi Pulsed Lavage with Suction - Normal Saline Used: 1000 mL Pulsed Lavage Tip: Tip with splash shield Selective Debridement Selective Debridement - Location: ischial tuberosity and sacrum Selective Debridement - Tools Used: Forceps;Scalpel Selective Debridement - Tissue Removed: eschar and yellow slough   Wound Assessment and Plan  Wound Therapy - Assess/Plan/Recommendations Wound Therapy - Clinical Statement: Ischial tuberosity wound with increased sanguineous drainage. Minimal sharp debridement accomplished due to would bleeding easily and  thin nature of yellow necrotic tissue. This patient  will benefit from continued hydrotherapy for selective removal of unviable tissue, to decrease bioburden and promote wound bed healing. Wound Therapy - Functional Problem List: Global weakness due to ALS Factors Delaying/Impairing Wound Healing: Immobility;Multiple medical problems Hydrotherapy Plan: Debridement;Dressing change;Pulsatile lavage with suction;Patient/family education Wound Therapy - Frequency: 6X / week Wound Therapy - Follow Up Recommendations: Other (comment) (LTACH) Wound Plan: See above  Wound Therapy Goals- Improve the function of patient's integumentary system by progressing the wound(s) through the phases of wound healing (inflammation - proliferation - remodeling) by: Decrease Necrotic Tissue to: 20% Decrease Necrotic Tissue - Progress: Progressing toward goal Increase Granulation Tissue to: 80% Increase Granulation Tissue - Progress: Progressing toward goal Goals/treatment plan/discharge plan were made with and agreed upon by patient/family: No, Patient unable to participate in goals/treatment/discharge plan and family unavailable Time For Goal Achievement: 7 days Wound Therapy - Potential for Goals: Good  Goals will be updated until maximal potential achieved or discharge criteria met.  Discharge criteria: when goals achieved, discharge from hospital, MD decision/surgical intervention, no progress towards goals, refusal/missing three consecutive treatments without notification or medical reason.  GP  Wyona Almas, PT, DPT Acute Rehabilitation Services Pager [phone] Office [phone]      Deno Etienne 08/21/2020, 10:38 AM

## 2020-08-22 LAB — CULTURE, BLOOD (ROUTINE X 2)
Culture: NO GROWTH
Special Requests: ADEQUATE

## 2020-08-22 LAB — GLUCOSE, CAPILLARY
Glucose-Capillary: 101 mg/dL — ABNORMAL HIGH (ref 70–99)
Glucose-Capillary: 90 mg/dL (ref 70–99)
Glucose-Capillary: 104 mg/dL — ABNORMAL HIGH (ref 70–99)
Glucose-Capillary: 148 mg/dL — ABNORMAL HIGH (ref 70–99)
Glucose-Capillary: 125 mg/dL — ABNORMAL HIGH (ref 70–99)
Glucose-Capillary: 128 mg/dL — ABNORMAL HIGH (ref 70–99)

## 2020-08-22 MED ORDER — FREE WATER
100.0000 mL | Status: DC
  Administered 2020-08-22 – 2020-08-24 (×11): 100 mL

## 2020-08-22 NOTE — Progress Notes (Signed)
NAME:  Stuart Woods, MRN:  [id number], DOB:  10-Aug-1966, LOS: 6 ADMISSION DATE:  08/16/2020, CONSULTATION DATE:  08/16/20 REFERRING MD: Rhunette Croft , CHIEF COMPLAINT:  AMS   Brief History   Stuart Woods is a 54 yo M w/ PMH of ALS trach & vent dependent who presents to [facility] from [facility] due to encephalopathy. PCCM consulted for respiratory distress  History of present illness   Stuart Woods is a 54 yo M w/ PMH of ALS. History obtained via chart review. He was in his usual state of health until last Thursday when he was noted to be febrile, diaphoretic and altered. Chest X-ray at the faiclity showed RLL pneumonia and he was started on IV gentamicin. Despite the treatment, he did not have clinical improvement and he was brought to [facility] for evaluation.  Chart review shows that at baseline, he is able to communicate with nodding and shaking his head and follow directions.  Past Medical History  ALS, Trach & vent dependence  Significant Hospital Events   08/16/20 admitted for HAP in the setting of chronic respiratory failure secondary to ALS. Septic shock on levophed.  12/14 ID consulted and adjusted antibiotics to Zyvox and Recarbrio  12/15 Midodrine restarted, Levo off. Low tidal volumes on wean.  12/16: sputum culture growing pseudomonas.   Consults:  Infectious disease  Procedures:   Significant Diagnostic Tests:  12/13 IMPRESSION: Tube and catheter positions as described without pneumothorax. Patchy bibasilar airspace opacity, more on the left than on the right. Suspect multifocal pneumonia, although aspiration could present similarly. Both aspiration and pneumonia may present concurrently. Heart size normal.  Micro Data:  08/16/20 COVID /Flu neg 08/16/20 Blood culture >> 08/16/20 Urine culture >>  Antimicrobials:  12/13 Vancomycin>12/14 12/13 Cefepime > 12/14 12/13 Flagyl >12/13 Recarbrio 12/14 Zyvox 12/14 >12/16 Caftolozane-tazobactam 12/15 >  Interim  history/subjective:  No acute events overnight. Had brief episode of dyspnea and diaphoresis but this resolved spontaneously without intervention.  Objective   Blood pressure (!) 155/92, pulse 90, temperature (!) 96.3 F (35.7 C), temperature source Axillary, resp. rate (!) 22, height 5\' 11"  (1.803 m), weight 62.4 kg, SpO2 95 %.    Vent Mode: PRVC FiO2 (%):  [40 %] 40 % Set Rate:  [21 bmp] 21 bmp Vt Set:  [500 mL] 500 mL PEEP:  [5 cmH20] 5 cmH20 Plateau Pressure:  [24 cmH20-27 cmH20] 27 cmH20   Intake/Output Summary (Last 24 hours) at 08/22/2020 1429 Last data filed at 08/22/2020 1328 Gross per 24 hour  Intake 2464.78 ml  Output 4275 ml  Net -1810.22 ml   Filed Weights   08/20/20 0249 08/21/20 0500 08/22/20 0421  Weight: 62.5 kg 62.1 kg 62.4 kg    Examination:  Gen: 54 frail middle aged male on vent via trach HEENT: Stuart Woods/AT, Trach in place, minimal site drainage. PERRL CV: RRR, S1, S2 normal, No rubs, no murmurs, no gallops Pulm: Clear vent assisted breath sounds Abd: bowel sounds normoactive Extm: No edema.  Skin: Sacral, ischial pressure wounds.  Neuro: Awake, alert, tracks.   Resolved Hospital Problem list     Assessment & Plan:   Acute on chronic hypoxic respiratory failure secondary to HCAP  Present with AMS after failing outpatient abx therapy with gentamicin. Febrile, tachypneic and X-ray w/ bibasilar opacities. COVID negative.  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 Sputum cultures showing pseudomonas resistant to ceftaz, cipro, imipenem, cefepime, sensitive to gentamycin.  P: - Zyrbaxa per ID through 12/20. Appreciate their assistance.  - ID sending additional  sensitivities out for evaluation.   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  - Full vent support - Bronchial hygiene - Has  consistently low tidal volumes on wean.  - Riluzole 50mg  BID - Oxycodone for pain  Acute Metabolic Encephalopathy: improving: Likely secondary to sepsis and sedating medications - Supportive care  Hypovolemic Hypernatremia: improved Hypokalemia P: - TF and Free water - Trend bmp, Mg, phos - K replaced  Chronic pressure ulcers Noted multiple ulcers on exam.  - Wound care consulting  Goals of care: Full code aggressive measures. Patient has strict Jewish beliefs and has certain requirements should he pass while in the hospital. Please refer to letter provided by his wife, , should this unfortunate event take place. Notably, his body is not to be washed and call the funeral home immediately as he requires burial within 24 hours.   Ready for return to [facility] medically, however, [facility] unable to provide Zyrbaxa. Will plan to finish course here with tentative return to [facility] 12/21.  Best practice (evaluated daily)   Diet: NPO, TF at goal.  Pain/Anxiety/Delirium protocol (if indicated):Oxycodone. PRN Fentanyl VAP protocol (if indicated): per protocol DVT prophylaxis: lovenox GI prophylaxis: Famotidine Glucose control: blood sugars controlled.  Mobility: BR last date of multidisciplinary goals of care discussion: due 12/20 Family and staff present  Summary of discussion: Follow up goals of care discussion due: Family update Wife 1/21 updated at bedside.    Code Status: Full Disposition: ICU  The patient is critically ill with multiple organ systems failure and requires high complexity decision making for assessment and support, frequent evaluation and titration of therapies, application of advanced monitoring technologies and extensive interpretation of multiple databases.   Critical Care Time devoted to patient care services described in this note is 31 minutes. This time reflects time of care of this signee Irving Burton . This critical care time does not reflect  separately billable procedures or procedure time, teaching time or supervisory time of PA/NP/Med student/Med Resident etc but could involve care discussion time.  Charlott Holler [place] [facility] 08/22/2020 2:29 PM  Pager: [phone] After hours pager: [phone]   Labs   CBC: Recent Labs  Lab 08/16/20 1303 08/16/20 1731 08/17/20 1130 08/18/20 0352 08/19/20 0326 08/20/20 0544 08/21/20 0307  WBC 16.3*   < > 12.3* 11.9* 13.8* 14.6* 21.0*  NEUTROABS 13.7*  --   --   --   --   --  17.5*  HGB 10.3*   < > 7.8* 7.5* 7.7* 7.8* 7.8*  HCT 36.5*   < > 27.8* 26.2* 24.4* 25.6* 25.7*  MCV 95.5   < > 94.9 93.6 88.7 89.8 89.5  PLT 687*   < > 460* 565* 543* 546* 614*   < > = values in this interval not displayed.    Basic Metabolic Panel: Recent Labs  Lab 08/16/20 2257 08/16/20 2319 08/17/20 0216 08/17/20 0352 08/17/20 1018 08/18/20 0352 08/19/20 0326 08/20/20 0544 08/21/20 0307  NA  --    < > 152*   < > 152* 143 143 136 137  K  --    < > 3.4*   < > 3.8 3.2* 3.6 3.8 3.8  CL  --   --  107  --  107 104 104 99 97*  CO2  --   --  34*  --  31 30 31 31  33*  GLUCOSE  --   --  114*  --  102* 128* 161* 128* 119*  BUN  --   --  38*  --  27* 28* 22* 22* 20  CREATININE  --   --  <0.30*  --  <0.30* <0.30* <0.30* <0.30* <0.30*  CALCIUM  --   --  9.2  --  9.4 8.3* 8.3* 8.1* 8.1*  MG 2.0  --  1.9  --   --   --  1.7 2.0 2.1  PHOS 2.0*  --  2.1*  --   --   --  2.8 2.6  --    < > = values in this interval not displayed.   GFR: CrCl cannot be calculated (This lab value cannot be used to calculate CrCl because it is not a number: <0.30). Recent Labs  Lab 08/16/20 1303 08/16/20 2257 08/17/20 0216 08/17/20 1018 08/17/20 1130 08/18/20 0352 08/18/20 0405 08/19/20 0326 08/20/20 0544 08/21/20 0307  PROCALCITON 0.97  --   --   --   --   --   --   --   --   --   WBC 16.3*  --  13.8*  --    < > 11.9*  --  13.8* 14.6* 21.0*  LATICACIDVEN 1.6 3.0* 3.3* 3.1*  --   --   1.6  --   --   --    < > = values in this interval not displayed.    Liver Function Tests: Recent Labs  Lab 08/16/20 1303 08/17/20 0216 08/20/20 0544 08/21/20 0307  AST 20 19 16 15   ALT 13 12 14 14   ALKPHOS 90 82 86 93  BILITOT 0.4 0.6 0.4 0.3  PROT 7.2 6.2* 5.5* 5.6*  ALBUMIN 1.6* 1.4* 1.2* 1.2*   No results for input(s): LIPASE, AMYLASE in the last 168 hours. No results for input(s): AMMONIA in the last 168 hours.  ABG    Component Value Date/Time   PHART 7.449 08/17/2020 0352   PCO2ART 54.5 (H) 08/17/2020 0352   PO2ART 73 (L) 08/17/2020 0352   HCO3 38.2 (H) 08/17/2020 0352   TCO2 40 (H) 08/17/2020 0352   ACIDBASEDEF 1.0 02/09/2020 0451   O2SAT 95.0 08/17/2020 0352     Coagulation Profile: Recent Labs  Lab 08/16/20 1303  INR 1.3*    Cardiac Enzymes: No results for input(s): CKTOTAL, CKMB, CKMBINDEX, TROPONINI in the last 168 hours.  HbA1C: Hgb A1c MFr Bld  Date/Time Value Ref Range Status  02/09/2020 02:48 AM 5.3 4.8 - 5.6 % Final    Comment:    (NOTE) Pre diabetes:          5.7%-6.4% Diabetes:              >6.4% Glycemic control for   <7.0% adults with diabetes     CBG: Recent Labs  Lab 08/21/20 2012 08/21/20 2348 08/22/20 0408 08/22/20 0756 08/22/20 1213  GLUCAP 109* 104* 101* 90 104*

## 2020-08-23 LAB — CBC
HCT: 26.3 % — ABNORMAL LOW (ref 39.0–52.0)
Hemoglobin: 7.7 g/dL — ABNORMAL LOW (ref 13.0–17.0)
MCH: 27.3 pg (ref 26.0–34.0)
MCHC: 29.3 g/dL — ABNORMAL LOW (ref 30.0–36.0)
MCV: 93.3 fL (ref 80.0–100.0)
Platelets: 616 10*3/uL — ABNORMAL HIGH (ref 150–400)
RBC: 2.82 MIL/uL — ABNORMAL LOW (ref 4.22–5.81)
RDW: 15 % (ref 11.5–15.5)
WBC: 14.7 10*3/uL — ABNORMAL HIGH (ref 4.0–10.5)
nRBC: 0 % (ref 0.0–0.2)

## 2020-08-23 LAB — GLUCOSE, CAPILLARY
Glucose-Capillary: 83 mg/dL (ref 70–99)
Glucose-Capillary: 116 mg/dL — ABNORMAL HIGH (ref 70–99)
Glucose-Capillary: 109 mg/dL — ABNORMAL HIGH (ref 70–99)
Glucose-Capillary: 109 mg/dL — ABNORMAL HIGH (ref 70–99)
Glucose-Capillary: 97 mg/dL (ref 70–99)

## 2020-08-23 LAB — COMPREHENSIVE METABOLIC PANEL
ALT: 14 U/L (ref 0–44)
AST: 13 U/L — ABNORMAL LOW (ref 15–41)
Albumin: 1.3 g/dL — ABNORMAL LOW (ref 3.5–5.0)
Alkaline Phosphatase: 99 U/L (ref 38–126)
Anion gap: 8 (ref 5–15)
BUN: 23 mg/dL — ABNORMAL HIGH (ref 6–20)
CO2: 34 mmol/L — ABNORMAL HIGH (ref 22–32)
Calcium: 8.4 mg/dL — ABNORMAL LOW (ref 8.9–10.3)
Chloride: 97 mmol/L — ABNORMAL LOW (ref 98–111)
Creatinine, Ser: <0.3 mg/dL — ABNORMAL LOW (ref 0.61–1.24)
Glucose, Bld: 125 mg/dL — ABNORMAL HIGH (ref 70–99)
Potassium: 4.3 mmol/L (ref 3.5–5.1)
Sodium: 139 mmol/L (ref 135–145)
Total Bilirubin: 0.4 mg/dL (ref 0.3–1.2)
Total Protein: 6 g/dL — ABNORMAL LOW (ref 6.5–8.1)

## 2020-08-23 LAB — MAGNESIUM: Magnesium: 2 mg/dL (ref 1.7–2.4)

## 2020-08-23 NOTE — Consult Note (Addendum)
WOC Nurse wound follow up Patient receiving care in [facility] 2M13.  PT present at time of my visit. Please see PT notes for left ischial and sacral wounds.  Joint decision with PT made to stop hydrotherapy and santyl to the left ischium, and begin twice daily saline moistened gauze to the area.  Hydrotherapy and santyl will continue to the sacral wound.  Orders entered reflect the change to the santyl and to insert saline moistened narrow gauze into the left ischial wound.  There is a tunnel that goes from the top of the wound 5.1 cm.  Be sure to insert the gauze into that tunnel. Then cover with a square foam dressing.   Additionally, the patient has buttock fungal skin damage.  For these areas the following order has been entered: Apply Skin Protective wipe to reddened skin around wounds and anus. Sprinkle antifungal powder (green and white bottle in room) onto the areas moistened with skin protectant. Then dab the powder with a skin protectant wipe.  Allow to dry.  Helmut Muster, RN, MSN, CWOCN, CNS-BC, pager [phone]

## 2020-08-23 NOTE — Progress Notes (Signed)
CSW spoke with Dr. Merrily Pew - MD states the patient will be ready to return to [facility] tomorrow after his IV antibiotics are finished.  CSW informed Prem at [facility] of information.  Edwin Dada, MSW, LCSW-A Transitions of Care  Clinical Social Worker I [facility] Emergency Departments  Medical ICU [phone]

## 2020-08-23 NOTE — Progress Notes (Signed)
Physical Therapy Wound Treatment Patient Details  Name: Stuart Woods MRN: [id number] Date of Birth: February 06, 1966  Today's Date: 08/23/2020 Time: 1023-1059 Time Calculation (min): 36 min  Subjective  Subjective: Awake and alert on vent Patient and Family Stated Goals: heal wounds; see improvement (per wife) Date of Onset:  (Unknown) Prior Treatments: Dressing changes  Pain Score:  Pt was premedicated and appeared to tolerate well basing on faces pain scale 0/10  Wound Assessment  Pressure Injury 02/09/20 Coccyx Left Unstageable - Full thickness tissue loss in which the base of the injury is covered by slough (yellow, tan, gray, green or brown) and/or eschar (tan, brown or black) in the wound bed. 08/17/20 staging update at the ti (Active)  Dressing Type ABD;Barrier Film (skin prep);Gauze (Comment);Moist to dry 08/23/20 1100  Dressing Changed;Clean;Dry;Intact 08/23/20 1100  Dressing Change Frequency Daily 08/23/20 1100  State of Healing Eschar 08/23/20 1100  Site / Wound Assessment Red;Yellow 08/23/20 1100  % Wound base Red or Granulating 40% 08/23/20 1100  % Wound base Yellow/Fibrinous Exudate 60% 08/23/20 1100  % Wound base Black/Eschar 0% 08/23/20 1100  % Wound base Other/Granulation Tissue (Comment) 0% 08/23/20 1100  Peri-wound Assessment Intact 08/23/20 1100  Wound Length (cm) 4.5 cm 08/17/20 1420  Wound Width (cm) 4 cm 08/17/20 1420  Wound Depth (cm) 0.1 cm 08/17/20 1420  Wound Surface Area (cm^2) 18 cm^2 08/17/20 1420  Wound Volume (cm^3) 1.8 cm^3 08/17/20 1420  Tunneling (cm) 0 08/17/20 1420  Undermining (cm) 0 08/17/20 1420  Margins Unattached edges (unapproximated) 08/23/20 1100  Drainage Amount Minimal 08/23/20 1100  Drainage Description Serosanguineous 08/23/20 1100  Treatment Debridement (Selective);Hydrotherapy (Pulse lavage);Packing (Saline gauze) 08/23/20 1100   Santyl applied to wound bed prior to applying dressing.    Pressure Injury 08/17/20 Ischial tuberosity  Left Unstageable - Full thickness tissue loss in which the base of the injury is covered by slough (yellow, tan, gray, green or brown) and/or eschar (tan, brown or black) in the wound bed. (Active)  Dressing Type Foam - Lift dressing to assess site every shift;Gauze (Comment);Moist to dry 08/23/20 1100  Dressing Changed;Clean;Dry;Intact 08/23/20 1100  Dressing Change Frequency Daily 08/23/20 1100  State of Healing Early/partial granulation 08/23/20 1100  Site / Wound Assessment Yellow;Red;Other (Comment) 08/23/20 1100  % Wound base Red or Granulating 45% 08/23/20 1100  % Wound base Yellow/Fibrinous Exudate 45% 08/23/20 1100  % Wound base Black/Eschar 0% 08/23/20 1100  % Wound base Other/Granulation Tissue (Comment) 10% 08/23/20 1100  Peri-wound Assessment Intact;Erythema (blanchable) 08/23/20 1100  Wound Length (cm) 4 cm 08/17/20 1420  Wound Width (cm) 4 cm 08/17/20 1420  Wound Depth (cm) 1.2 cm 08/17/20 1420  Wound Surface Area (cm^2) 16 cm^2 08/17/20 1420  Wound Volume (cm^3) 19.2 cm^3 08/17/20 1420  Undermining (cm) 3.2 cm from 10:00-12:00 08/17/20 1420  Margins Unattached edges (unapproximated) 08/23/20 1100  Drainage Amount Moderate 08/23/20 1100  Drainage Description Sanguineous 08/23/20 1100  Treatment Debridement (Selective);Hydrotherapy (Pulse lavage);Packing (Saline gauze) 08/23/20 1100  Santyl applied to wound bed prior to applying dressing.   Hydrotherapy Pulsed lavage therapy - wound location: ischial tuberosity and sacrum Pulsed Lavage with Suction (psi): 8 psi Pulsed Lavage with Suction - Normal Saline Used: 1000 mL Pulsed Lavage Tip: Tip with splash shield Selective Debridement Selective Debridement - Location: ischial tuberosity and sacrum Selective Debridement - Tools Used: Forceps;Scalpel Selective Debridement - Tissue Removed: eschar and yellow slough   Wound Assessment and Plan  Wound Therapy - Assess/Plan/Recommendations Wound Therapy - Clinical Statement:  Judeen Hammans, Bell RN present to assess wounds with therapist. Today will be the last day that hydrotherapy treats the ischial wound, but will continue treating the sacral wound. This patient will benefit from continued hydrotherapy for selective removal of unviable tissue, to decrease bioburden and promote wound bed healing. Wound Therapy - Functional Problem List: Global weakness due to ALS Factors Delaying/Impairing Wound Healing: Immobility;Multiple medical problems Hydrotherapy Plan: Debridement;Dressing change;Pulsatile lavage with suction;Patient/family education Wound Therapy - Frequency: 6X / week Wound Therapy - Follow Up Recommendations: Other (comment) (LTACH) Wound Plan: See above  Wound Therapy Goals- Improve the function of patient's integumentary system by progressing the wound(s) through the phases of wound healing (inflammation - proliferation - remodeling) by: Decrease Necrotic Tissue to: 20% Decrease Necrotic Tissue - Progress: Progressing toward goal Increase Granulation Tissue to: 80% Increase Granulation Tissue - Progress: Progressing toward goal Goals/treatment plan/discharge plan were made with and agreed upon by patient/family: No, Patient unable to participate in goals/treatment/discharge plan and family unavailable Time For Goal Achievement: 7 days Wound Therapy - Potential for Goals: Good  Goals will be updated until maximal potential achieved or discharge criteria met.  Discharge criteria: when goals achieved, discharge from hospital, MD decision/surgical intervention, no progress towards goals, refusal/missing three consecutive treatments without notification or medical reason.  GP     Thelma Comp 08/23/2020, 1:59 PM  Rolinda Roan, PT, DPT Acute Rehabilitation Services Pager: [phone] Office: [phone]

## 2020-08-23 NOTE — Progress Notes (Addendum)
NAME:  Stuart Woods, MRN:  [id number], DOB:  Feb 20, 1966, LOS: 7 ADMISSION DATE:  08/16/2020, CONSULTATION DATE:  08/16/20 REFERRING MD: Rhunette Croft , CHIEF COMPLAINT:  AMS   Brief History   Stuart Woods is a 54 yo M w/ PMH of ALS trach & vent dependent who presents to [facility] from [facility] due to encephalopathy. PCCM consulted for respiratory distress Chest X-ray at the faiclity showed RLL pneumonia and he was started on IV gentamicin. Despite the treatment, he did not have clinical improvement and he was brought to [facility] for evaluation.  Chart review shows that at baseline, he is able to communicate with nodding and shaking his head and follow directions.  Past Medical History  ALS, Trach & vent dependence  Significant Hospital Events   08/16/20 admitted for HAP in the setting of chronic respiratory failure secondary to ALS. Septic shock on levophed.  12/14 ID consulted and adjusted antibiotics to Zyvox and Recarbrio  12/15 Midodrine restarted, Levo off. Low tidal volumes on wean.  12/16: sputum culture growing pseudomonas.   Consults:  Infectious disease  Procedures:   Significant Diagnostic Tests:  12/13 IMPRESSION: Tube and catheter positions as described without pneumothorax. Patchy bibasilar airspace opacity, more on the left than on the right. Suspect multifocal pneumonia, although aspiration could present similarly. Both aspiration and pneumonia may present concurrently. Heart size normal.  Micro Data:  08/16/20 COVID /Flu neg 08/16/20 Blood culture >> negative 08/16/20 Urine culture >> polymicrobial 12/14 respiratory culture >> MDR Pseudomonas  Antimicrobials:  12/13 Vancomycin>12/14 12/13 Cefepime > 12/14 12/13 Flagyl >12/13 Recarbrio 12/14 Zyvox 12/14 >12/16 Caftolozane-tazobactam 12/15 >  Interim history/subjective:  Patient remained afebrile, continue to have thick tan secretions via trach.  Objective   Blood pressure 112/73, pulse 84, temperature 98.6  F (37 C), temperature source Oral, resp. rate (!) 21, height 5\' 11"  (1.803 m), weight 56.9 kg, SpO2 96 %.    Vent Mode: PRVC FiO2 (%):  [40 %] 40 % Set Rate:  [20 bmp-21 bmp] 20 bmp Vt Set:  [500 mL] 500 mL PEEP:  [5 cmH20] 5 cmH20 Plateau Pressure:  [12 cmH20-25 cmH20] 24 cmH20   Intake/Output Summary (Last 24 hours) at 08/23/2020 1413 Last data filed at 08/23/2020 0600 Gross per 24 hour  Intake 1270.8 ml  Output 2425 ml  Net -1154.2 ml   Filed Weights   08/21/20 0500 08/22/20 0421 08/23/20 0417  Weight: 62.1 kg 62.4 kg 56.9 kg    Examination:  Gen: frail middle aged male on vent via trach HEENT: [place]/AT, Trach in place, minimal site drainage. PERRL CV: RRR, S1, S2 normal, No rubs, no murmurs, no gallops Pulm: Clear vent assisted breath sounds Abd: bowel sounds normoactive Extm: No edema.  Skin: Sacral, ischial pressure wounds.  Neuro: Awake, alert, tracks.   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  New full vent support Bronchial hygiene Continue Riluzole 50mg  BID Oxycodone for pain  Acute Metabolic Encephalopathy: improving: Likely secondary to sepsis and sedating medications Continue supportive care and avoid sedation  Chronic pressure ulcers Noted multiple ulcers on exam.  Wound care consulting  Goals of care: Full code aggressive measures. Patient has strict Jewish beliefs and has  certain requirements should he pass while in the hospital. Please refer to letter provided by his wife, Stuart Woods, should this unfortunate event take place. Notably, his body is not to be washed and call the funeral home immediately as he requires burial within 24 hours.   Ready for return to [facility] medically, however, [facility] unable to provide Zyrbaxa. Will plan to finish course here with tentative return to [facility] 12/21.  Best practice (evaluated daily)   Diet: NPO, TF at goal.  Pain/Anxiety/Delirium protocol (if indicated):Oxycodone. PRN Fentanyl VAP protocol (if indicated): per protocol DVT prophylaxis: lovenox GI prophylaxis: Famotidine Glucose control: blood sugars controlled.  Mobility: BR Last date of multidisciplinary goals of care discussion: 12/20 Family and staff present: Spoke with patient's wife over the phone Summary of discussion: Patient was made DNR Follow up goals of care discussion due: 12/27    Code Status: DNR Disposition: ICU   Labs   CBC: Recent Labs  Lab 08/18/20 0352 08/19/20 0326 08/20/20 0544 08/21/20 0307 08/23/20 0530  WBC 11.9* 13.8* 14.6* 21.0* 14.7*  NEUTROABS  --   --   --  17.5*  --   HGB 7.5* 7.7* 7.8* 7.8* 7.7*  HCT 26.2* 24.4* 25.6* 25.7* 26.3*  MCV 93.6 88.7 89.8 89.5 93.3  PLT 565* 543* 546* 614* 616*    Basic Metabolic Panel: Recent Labs  Lab 08/16/20 2257 08/16/20 2319 08/17/20 0216 08/17/20 0352 08/18/20 0352 08/19/20 0326 08/20/20 0544 08/21/20 0307 08/23/20 0530  NA  --    < > 152*   < > 143 143 136 137 139  K  --    < > 3.4*   < > 3.2* 3.6 3.8 3.8 4.3  CL  --   --  107   < > 104 104 99 97* 97*  CO2  --   --  34*   < > 30 31 31  33* 34*  GLUCOSE  --   --  114*   < > 128* 161* 128* 119* 125*  BUN  --   --  38*   < > 28* 22* 22* 20 23*  CREATININE  --   --  <0.30*   < > <0.30* <0.30* <0.30* <0.30* <0.30*  CALCIUM  --   --  9.2   < > 8.3* 8.3* 8.1* 8.1* 8.4*  MG 2.0  --  1.9  --   --  1.7 2.0 2.1 2.0  PHOS 2.0*  --  2.1*   --   --  2.8 2.6  --   --    < > = values in this interval not displayed.   GFR: CrCl cannot be calculated (This lab value cannot be used to calculate CrCl because it is not a number: <0.30). Recent Labs  Lab 08/16/20 2257 08/17/20 0216 08/17/20 1018 08/17/20 1130 08/18/20 0405 08/19/20 0326 08/20/20 0544 08/21/20 0307 08/23/20 0530  WBC  --  13.8*  --    < >  --  13.8* 14.6* 21.0* 14.7*  LATICACIDVEN 3.0* 3.3* 3.1*  --  1.6  --   --   --   --    < > = values in this interval not displayed.    Liver Function Tests: Recent Labs  Lab 08/17/20 0216 08/20/20 0544 08/21/20 0307 08/23/20 0530  AST 19 16 15  13*  ALT 12 14  14 14  ALKPHOS 82 86 93 99  BILITOT 0.6 0.4 0.3 0.4  PROT 6.2* 5.5* 5.6* 6.0*  ALBUMIN 1.4* 1.2* 1.2* 1.3*   No results for input(s): LIPASE, AMYLASE in the last 168 hours. No results for input(s): AMMONIA in the last 168 hours.  ABG    Component Value Date/Time   PHART 7.449 08/17/2020 0352   PCO2ART 54.5 (H) 08/17/2020 0352   PO2ART 73 (L) 08/17/2020 0352   HCO3 38.2 (H) 08/17/2020 0352   TCO2 40 (H) 08/17/2020 0352   ACIDBASEDEF 1.0 02/09/2020 0451   O2SAT 95.0 08/17/2020 0352     Coagulation Profile: No results for input(s): INR, PROTIME in the last 168 hours.  Cardiac Enzymes: No results for input(s): CKTOTAL, CKMB, CKMBINDEX, TROPONINI in the last 168 hours.  HbA1C: Hgb A1c MFr Bld  Date/Time Value Ref Range Status  02/09/2020 02:48 AM 5.3 4.8 - 5.6 % Final    Comment:    (NOTE) Pre diabetes:          5.7%-6.4% Diabetes:              >6.4% Glycemic control for   <7.0% adults with diabetes     CBG: Recent Labs  Lab 08/22/20 2029 08/22/20 2322 08/23/20 0427 08/23/20 0809 08/23/20 1155  GLUCAP 125* 128* 83 116* 109*     Cheri Fowler MD [facility] Pager: [phone] Mobile: [phone]

## 2020-08-24 LAB — GLUCOSE, CAPILLARY
Glucose-Capillary: 101 mg/dL — ABNORMAL HIGH (ref 70–99)
Glucose-Capillary: 129 mg/dL — ABNORMAL HIGH (ref 70–99)
Glucose-Capillary: 131 mg/dL — ABNORMAL HIGH (ref 70–99)

## 2020-08-24 NOTE — Progress Notes (Signed)
Physical Therapy Wound Treatment Patient Details  Name: Stuart Woods MRN: [id number] Date of Birth: Aug 16, 1966  Today's Date: 08/24/2020 Time: 1203-1247 Time Calculation (min): 44 min  Subjective  Subjective: Awake and alert on vent Patient and Family Stated Goals: heal wounds; see improvement (per wife) Date of Onset:  (Unknown) Prior Treatments: Dressing changes  Pain Score:  Pt premedicated and appeared to tolerate treatment well.   Wound Assessment  Pressure Injury 02/09/20 Coccyx Left Unstageable - Full thickness tissue loss in which the base of the injury is covered by slough (yellow, tan, gray, green or brown) and/or eschar (tan, brown or black) in the wound bed. 08/17/20 staging update at the ti (Active)  Dressing Type ABD;Barrier Film (skin prep);Gauze (Comment);Moist to dry 08/24/20 1304  Dressing Changed;Clean;Dry;Intact 08/24/20 1304  Dressing Change Frequency Daily 08/24/20 1304  State of Healing Non-healing 08/24/20 1304  Site / Wound Assessment Pink;Yellow 08/24/20 1304  % Wound base Red or Granulating 55% 08/24/20 1304  % Wound base Yellow/Fibrinous Exudate 45% 08/24/20 1304  % Wound base Black/Eschar 0% 08/24/20 1304  % Wound base Other/Granulation Tissue (Comment) 0% 08/24/20 1304  Peri-wound Assessment Intact;Excoriated;Erythema (blanchable) 08/24/20 1304  Wound Length (cm) 4.5 cm 08/17/20 1420  Wound Width (cm) 4 cm 08/17/20 1420  Wound Depth (cm) 0.1 cm 08/17/20 1420  Wound Surface Area (cm^2) 18 cm^2 08/17/20 1420  Wound Volume (cm^3) 1.8 cm^3 08/17/20 1420  Tunneling (cm) 0 08/17/20 1420  Undermining (cm) 0 08/17/20 1420  Margins Unattached edges (unapproximated) 08/24/20 1304  Drainage Amount Minimal 08/24/20 1304  Drainage Description Serosanguineous 08/24/20 1304  Treatment Debridement (Selective);Hydrotherapy (Pulse lavage);Packing (Saline gauze) 08/24/20 1304   Santyl applied to wound bed prior to applying dressing.     Hydrotherapy Pulsed  lavage therapy - wound location: sacrum Pulsed Lavage with Suction (psi): 12 psi Pulsed Lavage with Suction - Normal Saline Used: 1000 mL Pulsed Lavage Tip: Tip with splash shield Selective Debridement Selective Debridement - Location: sacrum Selective Debridement - Tools Used: Forceps;Scalpel Selective Debridement - Tissue Removed: yellow slough   Wound Assessment and Plan  Wound Therapy - Assess/Plan/Recommendations Wound Therapy - Clinical Statement: Wife present and with many questions which were answered within scope of practice. Sacral wound continues to improve in appearance with minimal unviable tissue debrided this session. This patient will benefit from continued hydrotherapy for selective removal of unviable tissue, to decrease bioburden and promote wound bed healing. Wound Therapy - Functional Problem List: Global weakness due to ALS Factors Delaying/Impairing Wound Healing: Immobility;Multiple medical problems Hydrotherapy Plan: Debridement;Dressing change;Pulsatile lavage with suction;Patient/family education Wound Therapy - Frequency: 6X / week Wound Therapy - Follow Up Recommendations: Other (comment) (LTACH) Wound Plan: See above  Wound Therapy Goals- Improve the function of patient's integumentary system by progressing the wound(s) through the phases of wound healing (inflammation - proliferation - remodeling) by: Decrease Necrotic Tissue to: 20% Decrease Necrotic Tissue - Progress: Progressing toward goal Increase Granulation Tissue to: 80% Increase Granulation Tissue - Progress: Progressing toward goal Goals/treatment plan/discharge plan were made with and agreed upon by patient/family: No, Patient unable to participate in goals/treatment/discharge plan and family unavailable Time For Goal Achievement: 7 days Wound Therapy - Potential for Goals: Good  Goals will be updated until maximal potential achieved or discharge criteria met.  Discharge criteria: when goals  achieved, discharge from hospital, MD decision/surgical intervention, no progress towards goals, refusal/missing three consecutive treatments without notification or medical reason.  GP     Thelma Comp 08/24/2020, 1:16 PM  Rolinda Roan, PT, DPT Acute Rehabilitation Services Pager: [phone] Office: [phone]

## 2020-08-24 NOTE — Progress Notes (Addendum)
Report Given to Quin Hoop and RN request for this RN to leave Flexiceal and the PICC line in. Spoke to MD [facility] and he wants the PICC line d/c prior to discharge. Patient discharge to [facility] via [employer]

## 2020-08-24 NOTE — Discharge Summary (Signed)
Physician Discharge Summary  Patient ID: Stuart Woods MRN: [id number] DOB/AGE: [id number] y.o.  Admit date: 08/24/2020 Discharge date: 08/24/2020  Problem List Active Problems:   Acute respiratory failure (HCC)   Healthcare-associated pneumonia   Sepsis with acute renal failure without septic shock (HCC)   Tracheostomy dependent (HCC)   Severe sepsis (HCC)  HPI:  54 yo M with ALS with chronic vent/trach, decubitus ulcers POA, history of MDR pseudomonas colonization, who resides at [facility]. On 12/13 the patient was encephalopathic, diaphoretic, febrile, and CXR at facility revealed RLL PNA, prompting presentation to [facility]. In the ED, PCCM was consulted for ventilator management, and upon evaluation of patient decided to admit the patient to ICU for further management and care.   Hospital Course: In summary:  The patient presented [facility] ED from [facility] with AMS and PNA. Pt was admitted to ICU with PNA. Broad antibiotics were initiated, and further adjusted over the hospital course by Infectious Disease as appropriate. The patient did develop septic shock and require pressors, which were then weaned off. The patient's respiratory sample resulted as MRD pseudomonas -- patient remained in ICU pending completion of Zerbaxa course. On 08/24/20 Zerbaxa course is complete and patient is to return to [facility].   Daily course as follows: 12/13 presented to [facility] from [facility]. Admitted to ICU. Started on broad spectrum antibiotics (vancomycin, cefepime, 1 dose of flagyl) 12/14 Minimal urine output. Hypotensive and started on pressors and given further fluids. Infectious Disease consulted-vanc changed to zyvox, cefepime changed to Recarbio. ID recommends consideration of comfort care 12/15 Goals of care discussion occurred guided by PCCM- aggressive measures continued, Full Code. ID changed Recarbrio to Zerbaxa (respiratory gram stain with GPC) 12/16 off of pressors, remained more  encephalopathic than baseline. pseudomonas growing from tracheal culture-- ID discontinued zyvox.  12/17 Mental states continues to improve. Plan to remain in ICU pending completion of Zerpaxa course, then return to [facility]. Pseudomonas identified as MDR. ID sign off. Received hydrotherapy  12/18 continued antibiotics, continuing wound care, ventilator management. Improving encephalopathy 12/19 Episode of dyspnea and diaphoresis with spontaneous resolution.  12/20 Decision to cease hydrotherapy and santyl to L ischium. Continuing hydrotherapy and santyl to sacral wound. Continuation of antibiotics  12/21 Antibiotics complete. Following discussion with Critical Care physician and family, patient's code status updated to DNR. Will receive hydrotherapy and return to [facility] 08/24/2020 afternoon  Physical Exam at discharge: General: Frail and chronically debilitated appearing M, trach/vent  HEENT: NCAT. Trache secure. Anicteric sclera CV: RRR s1s2 no rgm Pulm: Symmetrical chest expansion. Mechanically ventilated assisted breath sounds.  WHQ:PRFF, normoactive Ext: Decreased muscle bulk and tone. No edema  Skin: sacral decubitus ulcer. Ischium pressure wound.  Neuro: awake, and alert. tracking  Plan at discharge:  Patient will return to [facility]. The patient's prior-to-admission medications will be resumed, and prior-to-admission ventilator support will be resumed.   The patient's code status has updated to DNR    Labs at discharge Lab Results  Component Value Date   CREATININE <0.30 (L) 08/23/2020   BUN 23 (H) 08/23/2020   NA 139 08/23/2020   K 4.3 08/23/2020   CL 97 (L) 08/23/2020   CO2 34 (H) 08/23/2020   Lab Results  Component Value Date   WBC 14.7 (H) 08/23/2020   HGB 7.7 (L) 08/23/2020   HCT 26.3 (L) 08/23/2020   MCV 93.3 08/23/2020   PLT 616 (H) 08/23/2020   Lab Results  Component Value Date   ALT 14 08/23/2020   AST  13 (L) 08/23/2020   ALKPHOS 99 08/23/2020   BILITOT  0.4 08/23/2020   Lab Results  Component Value Date   INR 1.3 (H) 08/16/2020   INR 1.1 10/16/2019   INR 1.1 09/10/2019    Current radiology studies 12/13 CXR: Tube and catheter positions as described without pneumothorax. Patchy bibasilar airspace opacity, more on the left than on the right. Suspect multifocal pneumonia, although aspiration could present similarly. Both aspiration and pneumonia may present concurrently. Heart size normal.  Disposition:  Plan to return to [facility]   Allergies as of 08/24/2020   No Known Allergies     Medication List    STOP taking these medications   acetaminophen 500 MG tablet Commonly known as: TYLENOL   gentamicin 1.2-0.9 MG/ML-% Commonly known as: GARAMYCIN   sorbitol 70 % Soln     TAKE these medications   ascorbic acid 500 MG tablet Commonly known as: VITAMIN C Place 500 mg into feeding tube every evening. 3pm   Biotene OralBalance Dry Mouth Gel Use as directed 1 application in the mouth or throat 3 (three) times daily.   bisacodyl 10 MG suppository Commonly known as: DULCOLAX Place 10 mg rectally daily as needed for moderate constipation.   chlorhexidine 0.12 % solution Commonly known as: PERIDEX Use as directed 15 mLs in the mouth or throat See admin instructions. Swab mouth with chlorhexidine every shift   cloNIDine 0.1 MG tablet Commonly known as: CATAPRES Place 0.1 mg into feeding tube every 8 (eight) hours as needed (hypertensive episodes related to ALS).   Compleat Peptide 1.5 Liqd Place 25 mL/hr into feeding tube continuous.   nutrition supplement (JUVEN) Pack Place 1 packet into feeding tube 2 (two) times daily.   diphenhydrAMINE 25 MG tablet Commonly known as: BENADRYL 25 mg every 8 (eight) hours as needed for itching.   enoxaparin 30 MG/0.3ML injection Commonly known as: LOVENOX Inject 30 mg into the skin daily.   feeding supplement (PRO-STAT SUGAR FREE 64) Liqd Place 30 mLs into feeding tube daily.    folic acid 1 MG tablet Commonly known as: FOLVITE Place 2 mg into feeding tube every evening. 3pm   free water Soln Place 100 mLs into feeding tube every 6 (six) hours.   gabapentin 300 MG capsule Commonly known as: NEURONTIN Place 300 mg into feeding tube every 8 (eight) hours.   glycopyrrolate 1 MG tablet Commonly known as: ROBINUL Take 1 mg by mouth every 8 (eight) hours.   ipratropium-albuterol 0.5-2.5 (3) MG/3ML Soln Commonly known as: DUONEB Take 3 mLs by nebulization every 4 (four) hours as needed (shortness of breath/wheezing).   linaclotide 145 MCG Caps capsule Commonly known as: LINZESS Place 145 mcg into feeding tube daily before breakfast.   LORazepam 1 MG tablet Commonly known as: ATIVAN Place 1 mg into feeding tube daily at 12 noon.   melatonin 3 MG Tabs tablet Give 6 mg by tube at bedtime.   midodrine 5 MG tablet Commonly known as: PROAMATINE Place 5 mg into feeding tube every 8 (eight) hours as needed (SBP < or = 90).   multivitamin with minerals tablet Place 1 tablet into feeding tube daily.   ondansetron 4 MG tablet Commonly known as: ZOFRAN Place 4 mg into feeding tube every 6 (six) hours as needed for nausea or vomiting.   oxyCODONE 5 MG immediate release tablet Commonly known as: Oxy IR/ROXICODONE Place 5 mg into feeding tube See admin instructions. Take one tablet (5 mg) per tube every 6 hours,  may also take one tablet (5 mg) per tube 30 minutes prior to wound care   pantoprazole sodium 40 mg/20 mL Pack Commonly known as: PROTONIX Place 40 mg into feeding tube daily.   polyethylene glycol 17 g packet Commonly known as: MIRALAX / GLYCOLAX Take 17 g by mouth daily. What changed: how to take this   PROBIOTIC ADVANCED PO Place 1 capsule into feeding tube every 12 (twelve) hours. Ultimate Flora 30 billion   riluzole 50 MG tablet Commonly known as: RILUTEK Place 50 mg into feeding tube every 12 (twelve) hours.   scopolamine 1  MG/3DAYS Commonly known as: TRANSDERM-SCOP Place 1 patch onto the skin every 3 (three) days.   sennosides 8.8 MG/5ML syrup Commonly known as: SENOKOT Place 5 mLs into feeding tube at bedtime.   sertraline 100 MG tablet Commonly known as: ZOLOFT Place 100 mg into feeding tube daily.   shark liver oil-cocoa butter 0.25-88.44 % suppository Commonly known as: PREPARATION H Place 1 suppository rectally every 6 (six) hours as needed for hemorrhoids.   simethicone 80 MG chewable tablet Commonly known as: MYLICON Place 160 mg into feeding tube every 8 (eight) hours.   sodium chloride flush 0.9 % Soln injection Inject 10 mLs into the vein as needed (before and after medication administration/ flush each unused lumen).   traZODone 50 MG tablet Commonly known as: DESYREL Place 50 mg into feeding tube at bedtime.   Vitamin D 50 MCG (2000 UT) tablet Place 2,000 Units into feeding tube every evening. 3pm   zinc sulfate 220 (50 Zn) MG capsule Place 220 mg into feeding tube at bedtime.         Discharged Condition: stable  Time spent on discharge greater than 40 minutes.  Vital signs at Discharge. Temp:  [97.7 F (36.5 C)-98.7 F (37.1 C)] 97.7 F (36.5 C) (12/21 1156) Pulse Rate:  [76-101] 100 (12/21 1100) Resp:  [19-22] 19 (12/21 1100) BP: (94-142)/(62-89) 138/82 (12/21 1100) SpO2:  [95 %-100 %] 95 % (12/21 1100) FiO2 (%):  [40 %] 40 % (12/21 1140)   Signed:  Tessie Fass MSN, AGACNP-BC [facility] [phone] If no answer, [phone] 08/24/2020, 12:03 PM

## 2020-08-24 NOTE — Progress Notes (Addendum)
CSW spoke with Roxan Hockey, RN and Dr. Merrily Pew regarding patient. Patient is to receive hydrotherapy at 12pm today prior to discharge.  Patient will go to room 305 at [facility]. Patient will be transported via [facility] - CSW arranged for pickup at 2:30pm. The accepting MD is Dr. Michel Santee [facility]. The number to call for report is [phone].  Edwin Dada, MSW, LCSW-A Transitions of Care   Clinical Social Worker I [facility] Emergency Departments   Medical ICU [phone]

## 2020-09-06 LAB — MISC LABCORP TEST (SEND OUT)
LabCorp test name: 1
Labcorp test code: 9985

## 2020-09-17 LAB — MISC LABCORP TEST (SEND OUT)
LabCorp test name: 1
Labcorp test code: 96388

## 2020-09-22 LAB — CULTURE, RESPIRATORY W GRAM STAIN

## 2020-10-19 ENCOUNTER — Emergency Department (HOSPITAL_COMMUNITY): Payer: BC Managed Care – PPO

## 2020-10-19 ENCOUNTER — Emergency Department (HOSPITAL_COMMUNITY)
Admission: EM | Admit: 2020-10-19 | Discharge: 2020-10-20 | Disposition: A | Discharge status: Home / Self Care | Payer: BC Managed Care – PPO | Attending: Emergency Medicine | Admitting: Emergency Medicine

## 2020-10-19 ENCOUNTER — Other Ambulatory Visit: Payer: Self-pay

## 2020-10-19 ENCOUNTER — Encounter (HOSPITAL_COMMUNITY): Payer: Self-pay | Admitting: Emergency Medicine

## 2020-10-19 DIAGNOSIS — X58XXXD Exposure to other specified factors, subsequent encounter: Secondary | ICD-10-CM | POA: Diagnosis not present

## 2020-10-19 DIAGNOSIS — Z9911 Dependence on respirator [ventilator] status: Secondary | ICD-10-CM | POA: Insufficient documentation

## 2020-10-19 DIAGNOSIS — R000 Tachycardia, unspecified: Secondary | ICD-10-CM | POA: Insufficient documentation

## 2020-10-19 DIAGNOSIS — S31809D Unspecified open wound of unspecified buttock, subsequent encounter: Secondary | ICD-10-CM | POA: Insufficient documentation

## 2020-10-19 DIAGNOSIS — Z5189 Encounter for other specified aftercare: Secondary | ICD-10-CM

## 2020-10-19 LAB — CBC WITH DIFFERENTIAL/PLATELET
Abs Immature Granulocytes: 0.09 10*3/uL — ABNORMAL HIGH (ref 0.00–0.07)
Basophils Absolute: 0.1 10*3/uL (ref 0.0–0.1)
Basophils Relative: 0 %
Eosinophils Absolute: 0.2 10*3/uL (ref 0.0–0.5)
Eosinophils Relative: 1 %
HCT: 37.2 % — ABNORMAL LOW (ref 39.0–52.0)
Hemoglobin: 10.8 g/dL — ABNORMAL LOW (ref 13.0–17.0)
Immature Granulocytes: 1 %
Lymphocytes Relative: 10 %
Lymphs Abs: 1.8 10*3/uL (ref 0.7–4.0)
MCH: 26.4 pg (ref 26.0–34.0)
MCHC: 29 g/dL — ABNORMAL LOW (ref 30.0–36.0)
MCV: 91 fL (ref 80.0–100.0)
Monocytes Absolute: 2.2 10*3/uL — ABNORMAL HIGH (ref 0.1–1.0)
Monocytes Relative: 12 %
Neutro Abs: 13.7 10*3/uL — ABNORMAL HIGH (ref 1.7–7.7)
Neutrophils Relative %: 76 %
Platelets: 737 10*3/uL — ABNORMAL HIGH (ref 150–400)
RBC: 4.09 MIL/uL — ABNORMAL LOW (ref 4.22–5.81)
RDW: 17.7 % — ABNORMAL HIGH (ref 11.5–15.5)
WBC: 18.1 10*3/uL — ABNORMAL HIGH (ref 4.0–10.5)
nRBC: 0 % (ref 0.0–0.2)

## 2020-10-19 LAB — COMPREHENSIVE METABOLIC PANEL
ALT: 17 U/L (ref 0–44)
AST: 21 U/L (ref 15–41)
Albumin: 2.2 g/dL — ABNORMAL LOW (ref 3.5–5.0)
Alkaline Phosphatase: 117 U/L (ref 38–126)
Anion gap: 10 (ref 5–15)
BUN: 22 mg/dL — ABNORMAL HIGH (ref 6–20)
CO2: 29 mmol/L (ref 22–32)
Calcium: 8.9 mg/dL (ref 8.9–10.3)
Chloride: 97 mmol/L — ABNORMAL LOW (ref 98–111)
Creatinine, Ser: <0.3 mg/dL — ABNORMAL LOW (ref 0.61–1.24)
Glucose, Bld: 99 mg/dL (ref 70–99)
Potassium: 4.4 mmol/L (ref 3.5–5.1)
Sodium: 136 mmol/L (ref 135–145)
Total Bilirubin: 0.5 mg/dL (ref 0.3–1.2)
Total Protein: 7.5 g/dL (ref 6.5–8.1)

## 2020-10-19 MED ORDER — LEVOFLOXACIN 500 MG PO TABS: 500.0000 mg | ORAL_TABLET | Freq: Every day | ORAL | 0 refills | Status: DC

## 2020-10-19 NOTE — ED Notes (Signed)
This RN called [facility] to discuss pt d/c info with [facility] RN

## 2020-10-19 NOTE — ED Provider Notes (Signed)
[name] Arrival date & time: 10/19/20  1527     History Chief Complaint  Patient presents with  . Wound Check    Stuart Bushmaneil Woods is a 55 y.o. male.  HPI Patient is a 55 year old male with a history of ALS who is vent dependent and resides at [facility].  Patient was sent to the emergency department due to chronic wounds to the sacrum and gluteal regions.  Per EMS, patient had the wounds debrided 2 weeks ago.  He has family who resides in [place] and they were concerned about the wounds not healing properly and requested that he be brought to the emergency department for wound evaluation.  Patient has a history of ALS as noted above and is nonverbal.  Level 5 caveat due to nonverbal status.    Past Medical History:  Diagnosis Date  . ALS (amyotrophic lateral sclerosis) [facility](HCC)     Patient Active Problem List   Diagnosis Date Noted  . Healthcare-associated pneumonia   . Sepsis with acute renal failure without septic shock (HCC)   . Tracheostomy dependent (HCC)   . Severe sepsis (HCC)   . Acute respiratory failure (HCC) 08/16/2020  . Small bowel obstruction (HCC) 02/09/2020  . Malnutrition of moderate degree 02/09/2020  . Abdominal pain 02/09/2020  . SBO (small bowel obstruction) (HCC) 02/09/2020  . ALS (amyotrophic lateral sclerosis) (HCC)   . Partial intestinal obstruction (HCC)   . Pneumothorax, right   . Ventilator dependence (HCC)   . Pressure injury of skin 09/23/2019  . Acute encephalopathy 09/22/2019    Past Surgical History:  Procedure Laterality Date  . GASTROSTOMY TUBE PLACEMENT    . TRACHEOSTOMY         No family history on file.  Social History   Tobacco Use  . Smoking status: Never Smoker  . Smokeless tobacco: Never Used  Substance Use Topics  . Alcohol use: Never  . Drug use: Never    Home Medications Prior to Admission medications   Medication Sig Start Date End Date  Taking? Authorizing Provider  Amino Acids-Protein Hydrolys (FEEDING SUPPLEMENT, PRO-STAT SUGAR FREE 64,) LIQD Place 30 mLs into feeding tube daily.    [provider]  Artificial Saliva (BIOTENE ORALBALANCE DRY MOUTH) GEL Use as directed 1 application in the mouth or throat 3 (three) times daily.    [provider]  ascorbic acid (VITAMIN C) 500 MG tablet Place 500 mg into feeding tube every evening. 3pm    [provider]  bisacodyl (DULCOLAX) 10 MG suppository Place 10 mg rectally daily as needed for moderate constipation.    [provider]  chlorhexidine (PERIDEX) 0.12 % solution Use as directed 15 mLs in the mouth or throat See admin instructions. Swab mouth with chlorhexidine every shift    [provider]  Cholecalciferol (VITAMIN D) 50 MCG (2000 UT) tablet Place 2,000 Units into feeding tube every evening. 3pm    [provider]  cloNIDine (CATAPRES) 0.1 MG tablet Place 0.1 mg into feeding tube every 8 (eight) hours as needed (hypertensive episodes related to ALS).    [provider]  diphenhydrAMINE (BENADRYL) 25 MG tablet 25 mg every 8 (eight) hours as needed for itching.    [provider]  enoxaparin (LOVENOX) 30 MG/0.3ML injection Inject 30 mg into the skin daily.    [provider]  folic acid (FOLVITE) 1 MG tablet Place 2 mg into feeding tube every evening. 3pm  [provider]  gabapentin (NEURONTIN) 300 MG capsule Place 300 mg into feeding tube every 8 (eight) hours.     [provider]  glycopyrrolate (ROBINUL) 1 MG tablet Take 1 mg by mouth every 8 (eight) hours.    [provider]  ipratropium-albuterol (DUONEB) 0.5-2.5 (3) MG/3ML SOLN Take 3 mLs by nebulization every 4 (four) hours as needed (shortness of breath/wheezing).  Patient not taking: No sig reported    [provider]  levofloxacin (LEVAQUIN) 500 MG tablet Take 1 tablet (500 mg total) by mouth daily.  10/19/20  Yes Placido Sou, PA-C  linaclotide (LINZESS) 145 MCG CAPS capsule Place 145 mcg into feeding tube daily before breakfast.    [provider]  LORazepam (ATIVAN) 1 MG tablet Place 1 mg into feeding tube daily at 12 noon.    [provider]  Melatonin 3 MG TABS Give 6 mg by tube at bedtime.     [provider]  midodrine (PROAMATINE) 5 MG tablet Place 5 mg into feeding tube every 8 (eight) hours as needed (SBP < or = 90).    [provider]  Multiple Vitamins-Minerals (MULTIVITAMIN WITH MINERALS) tablet Place 1 tablet into feeding tube daily.     [provider]  nutrition supplement, JUVEN, (JUVEN) PACK Place 1 packet into feeding tube 2 (two) times daily.    [provider]  Nutritional Supplements (COMPLEAT PEPTIDE 1.5) LIQD Place 25 mL/hr into feeding tube continuous.    [provider]  ondansetron (ZOFRAN) 4 MG tablet Place 4 mg into feeding tube every 6 (six) hours as needed for nausea or vomiting.    [provider]  oxyCODONE (OXY IR/ROXICODONE) 5 MG immediate release tablet Place 5 mg into feeding tube See admin instructions. Take one tablet (5 mg) per tube every 6 hours, may also take one tablet (5 mg) per tube 30 minutes prior to wound care    [provider]  pantoprazole sodium (PROTONIX) 40 mg/20 mL PACK Place 40 mg into feeding tube daily.    [provider]  polyethylene glycol (MIRALAX / GLYCOLAX) 17 g packet Take 17 g by mouth daily. Patient taking differently: Place 17 g into feeding tube daily. 02/10/20   Norton Blizzard, NP  Probiotic Product (PROBIOTIC ADVANCED PO) Place 1 capsule into feeding tube every 12 (twelve) hours. Ultimate Flora 30 billion    [provider]  riluzole (RILUTEK) 50 MG tablet Place 50 mg into feeding tube every 12 (twelve) hours.    [provider]  scopolamine (TRANSDERM-SCOP) 1 MG/3DAYS Place 1 patch onto the skin every 3 (three) days.     [provider]  sennosides (SENOKOT) 8.8 MG/5ML syrup Place 5 mLs into feeding tube at bedtime.    [provider]  sertraline (ZOLOFT) 100 MG tablet Place 100 mg into feeding tube daily.    [provider]  shark liver oil-cocoa butter (PREPARATION H) 0.25-88.44 % suppository Place 1 suppository rectally every 6 (six) hours as needed for hemorrhoids.    [provider]  simethicone (MYLICON) 80 MG chewable tablet Place 160 mg into feeding tube every 8 (eight) hours.     [provider]  sodium chloride flush 0.9 % SOLN injection Inject 10 mLs into the vein as needed (before and after medication administration/ flush each unused lumen).    [provider]  traZODone (DESYREL) 50 MG tablet Place 50 mg into feeding tube at bedtime.    [provider]  Water For Irrigation, Sterile (FREE WATER) SOLN Place 100 mLs into feeding tube every 6 (six) hours.    [provider]  zinc sulfate 220 (50 Zn) MG capsule Place 220 mg into feeding tube at bedtime.     [provider]    Allergies    Patient has no known allergies.  Review of Systems   Review of Systems  Unable to perform ROS: Patient nonverbal   Physical Exam Updated Vital Signs BP 104/69   Pulse (!) 110   Temp 98.3 F (36.8 C) (Oral)   Resp (!) 28   Ht 5\' 11"  (1.803 m)   Wt 56.9 kg   SpO2 98%   BMI 17.50 kg/m   Physical Exam Vitals and nursing note reviewed.  Constitutional:      General: He is not in acute distress.    Appearance: He is not ill-appearing, toxic-appearing or diaphoretic.  HENT:     Head: Normocephalic and atraumatic.     Right Ear: External ear normal.     Left Ear: External ear normal.     Nose: Nose normal.     Mouth/Throat:     Mouth: Mucous membranes are moist.     Pharynx: Oropharynx is clear. No oropharyngeal exudate or posterior oropharyngeal erythema.  Eyes:     General: No scleral icterus.       Right eye: No  discharge.        Left eye: No discharge.     Extraocular Movements: Extraocular movements intact.     Conjunctiva/sclera: Conjunctivae normal.  Cardiovascular:     Rate and Rhythm: Regular rhythm. Tachycardia present.     Pulses: Normal pulses.     Heart sounds: Normal heart sounds. No murmur heard. No friction rub. No gallop.   Pulmonary:     Effort: Pulmonary effort is normal. No respiratory distress.     Breath sounds: Normal breath sounds. No stridor. No wheezing, rhonchi or rales.     Comments: Trach in place.  Patient vent dependent. Abdominal:     General: Abdomen is flat.     Palpations: Abdomen is soft.     Comments: Protuberant abdomen that is soft.  PEG tube in place.  No surrounding erythema.  Genitourinary:    Comments: Condom cath in place. Musculoskeletal:        General: Normal range of motion.     Cervical back: Normal range of motion and neck supple. No tenderness.  Skin:    General: Skin is warm and dry.     Comments: Two stage IV ulcers noted to the sacrum as well as the left gluteal region.  Additional stage II ulcer noted along the right gluteal region.  Please see images below.  No bleeding or purulent discharge noted from the sites.  No surrounding cellulitis.  Neurological:     Mental Status: Mental status is at baseline.     Left gluteal region s/p packing removal      Sacral region with exposed sacrum in the central wound   Right gluteal region  ED Results / Procedures / Treatments   Labs (all labs ordered are listed, but only abnormal results are displayed) Labs Reviewed  COMPREHENSIVE METABOLIC PANEL - Abnormal; Notable for the following components:      Result Value   Chloride 97 (*)    BUN 22 (*)    Creatinine, Ser <0.30 (*)    Albumin 2.2 (*)    All other components within normal limits  CBC WITH DIFFERENTIAL/PLATELET - Abnormal; Notable for the following components:   WBC 18.1 (*)    RBC 4.09 (*)    Hemoglobin 10.8 (*)    HCT  37.2 (*)    MCHC 29.0 (*)    RDW 17.7 (*)    Platelets 737 (*)    Neutro Abs 13.7 (*)    Monocytes Absolute 2.2 (*)    Abs Immature Granulocytes 0.09 (*)    All other components within normal limits   EKG None  Radiology DG Chest Portable 1 View  Result Date: 10/19/2020 CLINICAL DATA:  Evaluate for pneumonia. EXAM: PORTABLE CHEST 1 VIEW COMPARISON:  August 16, 2020 FINDINGS: A stable tracheostomy tube is noted. The lungs are mildly hyperinflated. Mild, diffuse chronic appearing increased lung markings are seen with mild, stable bibasilar linear scarring and/or atelectasis. There is mild blunting of the left costophrenic angle. The heart size and mediastinal contours are within normal limits. The visualized skeletal structures are unremarkable. IMPRESSION: Mild, diffuse chronic appearing increased lung markings with mild, stable bibasilar linear scarring and/or atelectasis. Electronically Signed   By: Aram Candela M.D.   On: 10/19/2020 21:25   Procedures Procedures   Medications Ordered in ED Medications - No data to display  ED Course  I have reviewed the triage vital signs and the nursing notes.  Pertinent labs & imaging results that were available during my care of the patient were reviewed by me and considered in my medical decision making (see chart for details).  Clinical Course as of 10/20/20 0019  Tue Oct 19, 2020  2124 I was unable to get hold of the patient's wife.  I spoke to his cousin who is also listed in his contacts.  She states that they spoke to the Librarian, academic at [facility] and were told that "they cannot provide an appropriate level of care" due to the wounds that are worsening in his sacral region.  Discussed likely ambulatory referral for the wound clinic.  They were also concerned about his pneumonia diagnosis 2 months ago that resulted in sepsis.  Will obtain a chest x-ray and reassess. [LJ]  2126 Patient was then discussed with the patient's nurse at  [facility].  She states that per record review that he was sent at the family's request to the emergency department for evaluation of his wounds.  The nurse also noted that they have a specialized wound nurse on staff that rounds on these patients.  Discussed his need for these resources if they are not being used. [LJ]    Clinical Course User Index [LJ] Placido Sou, PA-C   MDM Rules/Calculators/A&P                          Patient is a 55 year old male with a complicated medical history.  Vent dependent patient with a history of ALS.  Resides at [facility].  Sent to the emergency department today at the request of his family for evaluation of multiple sacral wounds.  Wounds today are nonbleeding and nonpurulent.  No surrounding cellulitis.  Please see images above.  Patient does have a leukocytosis of 18.1 with a neutrophilia of 23.7.  There are some immature granulocytes.  He was admitted about 2 months ago for sepsis secondary to pneumonia.  Based on my conversation with his relatives, they appeared to be very concerned regarding his recent pneumonia and this possibly recurring.  Though his white count is elevated today, based on review of his  records, it appears that he has had leukocytosis for the past few months with similar values.   Chest x-ray obtained at the request of his cousin which is reassuring.  Given his ventilator dependency and current health status as well as leukocytosis, will discharge on an additional course of levofloxacin.  I had multiple conversations with the patient's nurse at [facility].  She does note that they have a nurse on staff that rounds on patients with chronic wounds.  Discussed any additional resources if possible for his care.  We will also give an ambulatory referral to the wound clinic.  Findings today as well as plan was discussed in length with the patient's wife.  She understands that patient will likely be transferred back to [facility] this evening.  She is  planning on following up with the staff at [facility] regarding his wounds as well as his wound care referral.  Her questions were answered and she was amicable regarding the above plan and findings.  We will discharge patient back to [facility] at this time.  Final Clinical Impression(s) / ED Diagnoses Final diagnoses:  Visit for wound check   Rx / DC Orders ED Discharge Orders         Ordered    Ambulatory referral to Wound Clinic        10/19/20 2132    levofloxacin (LEVAQUIN) 500 MG tablet  Daily        10/19/20 2137           Placido Sou, PA-C 10/20/20 0021    Eber Hong, MD 10/24/20 2002

## 2020-10-19 NOTE — ED Notes (Signed)
This RN discussed pt d/c info with pt's wife, Irving Burton

## 2020-10-19 NOTE — ED Provider Notes (Signed)
This patient is an unfortunate 55 year old male, currently has a trach, PEG, rectal tube, chronic diarrhea, chronically bedbound and does not move out of bed.  He has ALS.  He has had some deep decubitus ulcers on his bilateral buttocks and sacrum, they are not healing, they have been adequately debrided packed and treated at the facility but concerns for the lack of healing prompted family to want him to be evaluated according to the paramedic report.  On my exam this patient has unremarkable vital signs, he has essentially no response to request for movement other than grinding his teeth which is his way of communicating.  I have personally looked at the decubitus ulcers which show no signs of foul-smelling drainage, there is no purulence, there is no bleeding, no surrounding cellulitis, this includes the midline sacrum as well as the bilateral buttocks.  His heels are slightly red but there is no open wounds.  No fever.  Medical screening examination/treatment/procedure(s) were conducted as a shared visit with non-physician practitioner(s) and myself.  I personally evaluated the patient during the encounter.  Clinical Impression:   Final diagnoses:  Visit for wound check         Eber Hong, MD 10/24/20 2001

## 2020-10-19 NOTE — ED Triage Notes (Signed)
Ventilated pt transferred from [facility] by carelink per family request to have a wound care expert to check on new pressure ulcer, no signs of infection or fever. BP 146/104, HR 104. SPO2 97% on the vent.

## 2020-10-19 NOTE — Discharge Instructions (Addendum)
I am going to start [place] on levofloxacin for a possible pneumonia.  His chest x-ray was reassuring today but given his high white blood cell count as well as his recent pneumonia, would like to err on the side of caution.  I am going to place him on 7 days of levofloxacin.  I have also put an ambulatory referral to our wound care center.  Their contact information is also below.  Please reach out to them to see if they can provide any additional resources to your team.  If his symptoms worsen, you can bring him back to the emergency department for reevaluation.

## 2020-10-20 ENCOUNTER — Telehealth: Payer: Self-pay | Admitting: *Deleted

## 2020-10-20 DIAGNOSIS — S31809D Unspecified open wound of unspecified buttock, subsequent encounter: Secondary | ICD-10-CM | POA: Diagnosis not present

## 2020-10-20 NOTE — Telephone Encounter (Signed)
Facility called regarding pt returning back without glasses.

## 2021-02-09 ENCOUNTER — Other Ambulatory Visit: Payer: Self-pay

## 2021-02-09 ENCOUNTER — Inpatient Hospital Stay (HOSPITAL_COMMUNITY)
Admission: EM | Admit: 2021-02-09 | Discharge: 2021-03-04 | DRG: 871 | Disposition: E | Payer: BC Managed Care – PPO | Attending: Pulmonary Disease | Admitting: Pulmonary Disease

## 2021-02-09 ENCOUNTER — Inpatient Hospital Stay: Payer: Self-pay

## 2021-02-09 ENCOUNTER — Emergency Department (HOSPITAL_COMMUNITY): Payer: BC Managed Care – PPO

## 2021-02-09 ENCOUNTER — Encounter (HOSPITAL_COMMUNITY): Payer: Self-pay | Admitting: Emergency Medicine

## 2021-02-09 ENCOUNTER — Inpatient Hospital Stay (HOSPITAL_COMMUNITY): Payer: BC Managed Care – PPO

## 2021-02-09 DIAGNOSIS — B964 Proteus (mirabilis) (morganii) as the cause of diseases classified elsewhere: Secondary | ICD-10-CM | POA: Diagnosis present

## 2021-02-09 DIAGNOSIS — T68XXXA Hypothermia, initial encounter: Secondary | ICD-10-CM

## 2021-02-09 DIAGNOSIS — Z20822 Contact with and (suspected) exposure to covid-19: Secondary | ICD-10-CM | POA: Diagnosis present

## 2021-02-09 DIAGNOSIS — J9610 Chronic respiratory failure, unspecified whether with hypoxia or hypercapnia: Secondary | ICD-10-CM

## 2021-02-09 DIAGNOSIS — E874 Mixed disorder of acid-base balance: Secondary | ICD-10-CM | POA: Diagnosis present

## 2021-02-09 DIAGNOSIS — E875 Hyperkalemia: Secondary | ICD-10-CM | POA: Diagnosis present

## 2021-02-09 DIAGNOSIS — Z931 Gastrostomy status: Secondary | ICD-10-CM | POA: Diagnosis not present

## 2021-02-09 DIAGNOSIS — Z515 Encounter for palliative care: Secondary | ICD-10-CM | POA: Diagnosis not present

## 2021-02-09 DIAGNOSIS — J189 Pneumonia, unspecified organism: Secondary | ICD-10-CM

## 2021-02-09 DIAGNOSIS — D649 Anemia, unspecified: Secondary | ICD-10-CM | POA: Diagnosis present

## 2021-02-09 DIAGNOSIS — R680 Hypothermia, not associated with low environmental temperature: Secondary | ICD-10-CM | POA: Diagnosis present

## 2021-02-09 DIAGNOSIS — J156 Pneumonia due to other aerobic Gram-negative bacteria: Secondary | ICD-10-CM | POA: Diagnosis present

## 2021-02-09 DIAGNOSIS — Z66 Do not resuscitate: Secondary | ICD-10-CM | POA: Diagnosis present

## 2021-02-09 DIAGNOSIS — R4182 Altered mental status, unspecified: Secondary | ICD-10-CM

## 2021-02-09 DIAGNOSIS — E871 Hypo-osmolality and hyponatremia: Secondary | ICD-10-CM | POA: Diagnosis present

## 2021-02-09 DIAGNOSIS — Z1624 Resistance to multiple antibiotics: Secondary | ICD-10-CM | POA: Diagnosis present

## 2021-02-09 DIAGNOSIS — G1221 Amyotrophic lateral sclerosis: Secondary | ICD-10-CM | POA: Diagnosis present

## 2021-02-09 DIAGNOSIS — Z8701 Personal history of pneumonia (recurrent): Secondary | ICD-10-CM

## 2021-02-09 DIAGNOSIS — J9621 Acute and chronic respiratory failure with hypoxia: Secondary | ICD-10-CM | POA: Diagnosis present

## 2021-02-09 DIAGNOSIS — Y95 Nosocomial condition: Secondary | ICD-10-CM | POA: Diagnosis present

## 2021-02-09 DIAGNOSIS — N179 Acute kidney failure, unspecified: Secondary | ICD-10-CM | POA: Diagnosis present

## 2021-02-09 DIAGNOSIS — Z681 Body mass index (BMI) 19 or less, adult: Secondary | ICD-10-CM

## 2021-02-09 DIAGNOSIS — J9622 Acute and chronic respiratory failure with hypercapnia: Secondary | ICD-10-CM | POA: Diagnosis present

## 2021-02-09 DIAGNOSIS — Z9911 Dependence on respirator [ventilator] status: Secondary | ICD-10-CM | POA: Diagnosis not present

## 2021-02-09 DIAGNOSIS — E872 Acidosis, unspecified: Secondary | ICD-10-CM

## 2021-02-09 DIAGNOSIS — Z7901 Long term (current) use of anticoagulants: Secondary | ICD-10-CM | POA: Diagnosis not present

## 2021-02-09 DIAGNOSIS — G9341 Metabolic encephalopathy: Secondary | ICD-10-CM | POA: Diagnosis present

## 2021-02-09 DIAGNOSIS — R6521 Severe sepsis with septic shock: Secondary | ICD-10-CM | POA: Diagnosis present

## 2021-02-09 DIAGNOSIS — R64 Cachexia: Secondary | ICD-10-CM | POA: Diagnosis present

## 2021-02-09 DIAGNOSIS — Z930 Tracheostomy status: Secondary | ICD-10-CM

## 2021-02-09 DIAGNOSIS — J939 Pneumothorax, unspecified: Secondary | ICD-10-CM

## 2021-02-09 DIAGNOSIS — R6520 Severe sepsis without septic shock: Secondary | ICD-10-CM

## 2021-02-09 DIAGNOSIS — A419 Sepsis, unspecified organism: Secondary | ICD-10-CM | POA: Diagnosis present

## 2021-02-09 DIAGNOSIS — Z7189 Other specified counseling: Secondary | ICD-10-CM | POA: Diagnosis not present

## 2021-02-09 LAB — BLOOD GAS, ARTERIAL
Acid-Base Excess: 9.5 mmol/L — ABNORMAL HIGH (ref 0.0–2.0)
Acid-Base Excess: 10.6 mmol/L — ABNORMAL HIGH (ref 0.0–2.0)
Bicarbonate: 38.7 mmol/L — ABNORMAL HIGH (ref 20.0–28.0)
Bicarbonate: 40.6 mmol/L — ABNORMAL HIGH (ref 20.0–28.0)
Drawn by: 27011
Drawn by: 12971
FIO2: 100
FIO2: 100
O2 Saturation: 98.3 %
O2 Saturation: 97.7 %
Patient temperature: 32.1
Patient temperature: 36.3
pCO2 arterial: >120 mmHg (ref 32.0–48.0)
pCO2 arterial: >120 mmHg (ref 32.0–48.0)
pH, Arterial: 7.186 — CL (ref 7.350–7.450)
pH, Arterial: 7.089 — CL (ref 7.350–7.450)
pO2, Arterial: 219 mmHg — ABNORMAL HIGH (ref 83.0–108.0)
pO2, Arterial: 172 mmHg — ABNORMAL HIGH (ref 83.0–108.0)

## 2021-02-09 LAB — APTT: aPTT: 55 seconds — ABNORMAL HIGH (ref 24–36)

## 2021-02-09 LAB — CBC WITH DIFFERENTIAL/PLATELET
Abs Immature Granulocytes: 0.17 10*3/uL — ABNORMAL HIGH (ref 0.00–0.07)
Basophils Absolute: 0.1 10*3/uL (ref 0.0–0.1)
Basophils Relative: 0 %
Eosinophils Absolute: 0.1 10*3/uL (ref 0.0–0.5)
Eosinophils Relative: 1 %
HCT: 31.7 % — ABNORMAL LOW (ref 39.0–52.0)
Hemoglobin: 8.4 g/dL — ABNORMAL LOW (ref 13.0–17.0)
Immature Granulocytes: 1 %
Lymphocytes Relative: 5 %
Lymphs Abs: 1.2 10*3/uL (ref 0.7–4.0)
MCH: 21.3 pg — ABNORMAL LOW (ref 26.0–34.0)
MCHC: 26.5 g/dL — ABNORMAL LOW (ref 30.0–36.0)
MCV: 80.3 fL (ref 80.0–100.0)
Monocytes Absolute: 0.4 10*3/uL (ref 0.1–1.0)
Monocytes Relative: 2 %
Neutro Abs: 21.4 10*3/uL — ABNORMAL HIGH (ref 1.7–7.7)
Neutrophils Relative %: 91 %
Platelets: 348 10*3/uL (ref 150–400)
RBC: 3.95 MIL/uL — ABNORMAL LOW (ref 4.22–5.81)
RDW: 19.9 % — ABNORMAL HIGH (ref 11.5–15.5)
WBC: 23.4 10*3/uL — ABNORMAL HIGH (ref 4.0–10.5)
nRBC: 0.3 % — ABNORMAL HIGH (ref 0.0–0.2)

## 2021-02-09 LAB — URINALYSIS, ROUTINE W REFLEX MICROSCOPIC
Bilirubin Urine: NEGATIVE
Glucose, UA: NEGATIVE mg/dL
Hgb urine dipstick: NEGATIVE
Ketones, ur: 5 mg/dL — AB
Nitrite: NEGATIVE
Protein, ur: NEGATIVE mg/dL
Specific Gravity, Urine: 1.019 (ref 1.005–1.030)
pH: 6 (ref 5.0–8.0)

## 2021-02-09 LAB — BASIC METABOLIC PANEL
Anion gap: 5 (ref 5–15)
BUN: 33 mg/dL — ABNORMAL HIGH (ref 6–20)
CO2: 42 mmol/L — ABNORMAL HIGH (ref 22–32)
Calcium: 9.2 mg/dL (ref 8.9–10.3)
Chloride: 88 mmol/L — ABNORMAL LOW (ref 98–111)
Creatinine, Ser: <0.3 mg/dL — ABNORMAL LOW (ref 0.61–1.24)
Glucose, Bld: 81 mg/dL (ref 70–99)
Potassium: 5.6 mmol/L — ABNORMAL HIGH (ref 3.5–5.1)
Sodium: 135 mmol/L (ref 135–145)

## 2021-02-09 LAB — COMPREHENSIVE METABOLIC PANEL
ALT: 20 U/L (ref 0–44)
AST: 22 U/L (ref 15–41)
Albumin: 1.6 g/dL — ABNORMAL LOW (ref 3.5–5.0)
Alkaline Phosphatase: 229 U/L — ABNORMAL HIGH (ref 38–126)
Anion gap: 3 — ABNORMAL LOW (ref 5–15)
BUN: 34 mg/dL — ABNORMAL HIGH (ref 6–20)
CO2: 45 mmol/L — ABNORMAL HIGH (ref 22–32)
Calcium: 9.6 mg/dL (ref 8.9–10.3)
Chloride: 87 mmol/L — ABNORMAL LOW (ref 98–111)
Creatinine, Ser: <0.3 mg/dL — ABNORMAL LOW (ref 0.61–1.24)
Glucose, Bld: 103 mg/dL — ABNORMAL HIGH (ref 70–99)
Potassium: 5.4 mmol/L — ABNORMAL HIGH (ref 3.5–5.1)
Sodium: 135 mmol/L (ref 135–145)
Total Bilirubin: 0.1 mg/dL — ABNORMAL LOW (ref 0.3–1.2)
Total Protein: 7.6 g/dL (ref 6.5–8.1)

## 2021-02-09 LAB — RESP PANEL BY RT-PCR (FLU A&B, COVID) ARPGX2
Influenza A by PCR: NEGATIVE
Influenza B by PCR: NEGATIVE
SARS Coronavirus 2 by RT PCR: NEGATIVE

## 2021-02-09 LAB — HIV ANTIBODY (ROUTINE TESTING W REFLEX): HIV Screen 4th Generation wRfx: NONREACTIVE

## 2021-02-09 LAB — GLUCOSE, CAPILLARY: Glucose-Capillary: 110 mg/dL — ABNORMAL HIGH (ref 70–99)

## 2021-02-09 LAB — PROTIME-INR
INR: 1 (ref 0.8–1.2)
Prothrombin Time: 13.7 seconds (ref 11.4–15.2)

## 2021-02-09 LAB — LACTIC ACID, PLASMA
Lactic Acid, Venous: 0.9 mmol/L (ref 0.5–1.9)
Lactic Acid, Venous: 0.8 mmol/L (ref 0.5–1.9)

## 2021-02-09 MED ORDER — NOREPINEPHRINE 4 MG/250ML-% IV SOLN
0.0000 ug/min | INTRAVENOUS | Status: DC
  Administered 2021-02-09: 55 ug/min via INTRAVENOUS
  Administered 2021-02-09 (×2): 40 ug/min via INTRAVENOUS
  Administered 2021-02-09: 5 ug/min via INTRAVENOUS
  Administered 2021-02-10 (×4): 40 ug/min via INTRAVENOUS
  Filled 2021-02-09: qty 500
  Filled 2021-02-09 (×10): qty 250

## 2021-02-09 MED ORDER — SODIUM CHLORIDE 0.9 % IV SOLN
3.0000 g | Freq: Three times a day (TID) | INTRAVENOUS | Status: DC
  Administered 2021-02-09 – 2021-02-10 (×2): 3 g via INTRAVENOUS
  Filled 2021-02-09 (×7): qty 22.8

## 2021-02-09 MED ORDER — FENTANYL CITRATE (PF) 100 MCG/2ML IJ SOLN
50.0000 ug | INTRAMUSCULAR | Status: DC | PRN
Start: 2021-02-09 — End: 2021-02-10

## 2021-02-09 MED ORDER — DOCUSATE SODIUM 100 MG PO CAPS: 100.0000 mg | ORAL_CAPSULE | Freq: Two times a day (BID) | ORAL | Status: DC | PRN

## 2021-02-09 MED ORDER — HEPARIN SODIUM (PORCINE) 5000 UNIT/ML IJ SOLN
5000.0000 [IU] | Freq: Three times a day (TID) | INTRAMUSCULAR | Status: DC
  Administered 2021-02-09 – 2021-02-10 (×2): 5000 [IU] via SUBCUTANEOUS
  Filled 2021-02-09 (×2): qty 1

## 2021-02-09 MED ORDER — POLYETHYLENE GLYCOL 3350 17 G PO PACK: 17.0000 g | PACK | Freq: Every day | ORAL | Status: DC | PRN

## 2021-02-09 MED ORDER — LACTATED RINGERS IV SOLN: INTRAVENOUS | Status: DC

## 2021-02-09 MED ORDER — POLYETHYLENE GLYCOL 3350 17 G PO PACK: 17.0000 g | PACK | Freq: Every day | ORAL | Status: DC

## 2021-02-09 MED ORDER — IPRATROPIUM-ALBUTEROL 0.5-2.5 (3) MG/3ML IN SOLN: 3.0000 mL | RESPIRATORY_TRACT | Status: DC | PRN

## 2021-02-09 MED ORDER — LACTATED RINGERS IV BOLUS (SEPSIS)
1000.0000 mL | Freq: Once | INTRAVENOUS | Status: AC
  Administered 2021-02-09: 1000 mL via INTRAVENOUS

## 2021-02-09 MED ORDER — LACTATED RINGERS IV BOLUS
2000.0000 mL | Freq: Once | INTRAVENOUS | Status: AC
  Administered 2021-02-09: 2000 mL via INTRAVENOUS

## 2021-02-09 MED ORDER — LACTATED RINGERS IV BOLUS (SEPSIS): 500.0000 mL | Freq: Once | INTRAVENOUS | Status: DC

## 2021-02-09 MED ORDER — FENTANYL CITRATE (PF) 100 MCG/2ML IJ SOLN: 50.0000 ug | INTRAMUSCULAR | Status: DC | PRN

## 2021-02-09 MED ORDER — SODIUM CHLORIDE 0.9 % IV SOLN
3.0000 g | Freq: Once | INTRAVENOUS | Status: AC
  Administered 2021-02-09: 3 g via INTRAVENOUS
  Filled 2021-02-09: qty 22.8

## 2021-02-09 MED ORDER — SODIUM CHLORIDE 0.9% FLUSH: 10.0000 mL | INTRAVENOUS | Status: DC | PRN

## 2021-02-09 MED ORDER — DOCUSATE SODIUM 50 MG/5ML PO LIQD: 100.0000 mg | Freq: Two times a day (BID) | ORAL | Status: DC

## 2021-02-09 MED ORDER — LACTATED RINGERS IV BOLUS (SEPSIS): 250.0000 mL | Freq: Once | INTRAVENOUS | Status: DC

## 2021-02-09 MED ORDER — PANTOPRAZOLE SODIUM 40 MG PO PACK
40.0000 mg | PACK | Freq: Every day | ORAL | Status: DC
  Administered 2021-02-09 – 2021-02-10 (×2): 40 mg
  Filled 2021-02-09 (×3): qty 20

## 2021-02-09 MED ORDER — CHLORHEXIDINE GLUCONATE CLOTH 2 % EX PADS: 6.0000 | MEDICATED_PAD | Freq: Every day | CUTANEOUS | Status: DC

## 2021-02-09 MED ORDER — MORPHINE 100MG IN NS 100ML (1MG/ML) PREMIX INFUSION
4.0000 mg/h | INTRAVENOUS | Status: DC
  Administered 2021-02-09: 2 mg/h via INTRAVENOUS
  Filled 2021-02-09: qty 100

## 2021-02-09 MED ORDER — SODIUM CHLORIDE 0.9% FLUSH
10.0000 mL | Freq: Two times a day (BID) | INTRAVENOUS | Status: DC
  Administered 2021-02-09 – 2021-02-10 (×2): 10 mL

## 2021-02-09 NOTE — Progress Notes (Signed)
Peripherally Inserted Central Catheter Placement  The IV Nurse has discussed with the patient and/or persons authorized to consent for the patient, the purpose of this procedure and the potential benefits and risks involved with this procedure.  The benefits include less needle sticks, lab draws from the catheter, and the patient may be discharged home with the catheter. Risks include, but not limited to, infection, bleeding, blood clot (thrombus formation), and puncture of an artery; nerve damage and irregular heartbeat and possibility to perform a PICC exchange if needed/ordered by physician.  Alternatives to this procedure were also discussed.  Bard Power PICC patient education guide, fact sheet on infection prevention and patient information card has been provided to patient /or left at bedside.    PICC Placement Documentation  PICC Double Lumen 2021/03/06 PICC Right Brachial 35 cm 0 cm (Active)  Indication for Insertion or Continuance of Line Vasoactive infusions 2021/03/06 2152  Exposed Catheter (cm) 0 cm 03-06-21 2152  Site Assessment Clean;Dry;Intact 03/06/2021 2152  Lumen #1 Status Flushed;Blood return noted;Capped (Central line) 03/06/2021 2152  Lumen #2 Status Flushed;Blood return noted;Capped (Central line) Mar 06, 2021 2152  Dressing Type Transparent Mar 06, 2021 2152  Dressing Status Clean;Dry;Intact 06-Mar-2021 2152  Antimicrobial disc in place? Yes 2021/03/06 2152  Safety Lock Not Applicable 06-Mar-2021 2152  Line Care Connections checked and tightened 03-06-2021 2152  Line Adjustment (NICU/IV Team Only) No Mar 06, 2021 2152  Dressing Intervention New dressing 2021-03-06 2152  Dressing Change Due 02/16/21 2021/03/06 2152       Kaija Kovacevic, Norton Pastel 03/06/2021, 9:57 PM

## 2021-02-09 NOTE — ED Notes (Signed)
Respiratory was unable to get an ABG at this time

## 2021-02-09 NOTE — Progress Notes (Signed)
NP. Marylyn Ishihara notify for patient Ph 7.0 , Co2> 120, and increased need for  Levophed with BP 107/67, HR 109, Arterial pressure 86/50 MAP 66,. IV team contacted for pt need for PICC line.

## 2021-02-09 NOTE — Progress Notes (Signed)
Attempted to contact wife to obtain consent for PICC placement and there was no answer. Will reach out again.

## 2021-02-09 NOTE — H&P (Signed)
NAME:  Stuart Woods, MRN:  [id number], DOB:  [birth year], LOS: 0 ADMISSION DATE:  02/22/2021, CONSULTATION DATE:  6.8.2022 REFERRING MD:  Dr. Jeanell Sparrow, CHIEF COMPLAINT: Septic shock, AMS, and hypothermia   History of Present Illness:  Patient is a 55 yo M with pertinent PMH of ALS, chronic vent dependent trach patient from [facility], gastric tube placement admitted to [facility] 02/22/2021 with AMS, hypothermia, and septic shock.  Patient is chronic trach patient on chronic mechanical ventilation from [facility]. He has been a patient there since October 2020. He was recently admitted to [facility] in December 2021 for RLL pneumonia with pseudomonas colonization and septic shock. Patient required ID to follow due to antibiotic resistance and required a course of Zerbaxa.   On 02/08/2021, patient had a trach culture that shows Proteus mirabilis, pseudomonas, providencia stuartii, multidrug resistant, corneybacterium. Chest xray revelas multifocal infiltrate. Patient admitted to [facility] on 6/8 for worsening hypotension, AMS, and hypothermia. Labs in ED show: Hgb 8.8, WBC 23, K 5.4, ABG: 7.18, pco2 >120, po2 219, hco3 38.7, o2 sat 98.3. Patient mech vent mode changed to PCV due to high peak pressures on 8cc/kg and RR increased to 30. Pharmacy consulted and placed patient on Zerbaxa. Patients hypotension was fluid responsive after 3 LR boluses. Bair hugger in place and temp is currently 92.   PCCM consulted for vent management, hypothermia, and septic shock.   Pertinent  Medical History   Past Medical History:  Diagnosis Date  . ALS (amyotrophic lateral sclerosis) ([place])      Significant Hospital Events: Including procedures, antibiotic start and stop dates in addition to other pertinent events   . 6/8: Patient admitted to [facility] with septic shock and hypothermia. Trach patient on mech vent: PCV: increased rr to 30 and 100% 10 peep per resp acidosis and hypoxia.  Interim History / Subjective:   Trach patient on mech  vent: PCV 30, RR 30, 100%, peep 10, exhaled vt about 370, Sats 90% BP 90/60 after 3 LR bolus Temp increasing from 88 to 90 EJ for access.   Objective   Blood pressure 91/60, pulse 85, temperature (!) 92.9 F (33.8 C), resp. rate (!) 30, height '5\' 11"'  (1.803 m), weight 56 kg, SpO2 91 %.    Vent Mode: PCV FiO2 (%):  [50 %-100 %] 50 % Set Rate:  [18 bmp-30 bmp] 30 bmp PEEP:  [5 cmH20-10 cmH20] 5 cmH20 Plateau Pressure:  [34 cmH20-36 cmH20] 36 cmH20   Intake/Output Summary (Last 24 hours) at 02/16/2021 1400 Last data filed at 02/12/2021 1314 Gross per 24 hour  Intake 1000 ml  Output --  Net 1000 ml   Filed Weights   02/28/2021 0955  Weight: 56 kg    Examination: General: critically ill malnourished appearing. Temp 92 degree Fahrenheit.  Long term trach ventilator patient from [facility] HEENT: MM pink/moist; 7 mm provox trach in place without redness Neuro: non awake or responsive (has ALS; blinks at baseline) CV: s1s2, RRR, no m/r/g, BP 91/60 PULM: Dim BS bilaterally; Trach in place; on vent on PCV 10 peep and 100% sats 90% GI: soft, bsx4 active; G tube in place without erythema or drainage  Extremities: warm/dry, no LE edema Skin: no rashes or lesions   Labs/imaging that I havepersonally reviewed  (right click and "Reselect all SmartList Selections" daily)   ABG: 7.18, pco2 >120, po2 219, hco3 38.7, o2 sat 98.3  K 5.4 CO2 45 BUN 34, creatinine <0.3 Anion Gap 3 Alk Phosph 229 WBC 23.4  Hgb 8.4  CXR: diffuse bilalteral patchy airspace consolidation; likely multifocal pneumonia or pulmonary edema CT head: No acute intracranial abnormality  BCX2 and urine culture pending  Resolved Hospital Problem list     Assessment & Plan:   Acute on chronic respiratory failure with hypoxia and hypercapnia: chronic trach patient from [facility] HCAP: diffuse bilalteral patchy airspace consolidation; likely multifocal pneumonia; Trach culture 6/7 Proteus mirabilis,  pseudomonas, providencia stuartii, multidrug resistant, corneybacterium. Hx of ALS P: -Continue mech vent: PCV 40 and RR 30 (vt around 370 and MV 11); wean fio2 for sats >90% -repeat ABG and adjust settings accordingly -pulm toiletry -duoneb prn -Vap prevention protocol -Will consult ID for multidrug resistant bacteria -Continue Zarbaxa -CXR am  Septic shock  Leukocytosis Hypothermia P: -pressors for MAP >65 -Trend fever/CBC -continue abx above -PICC team consulted for placement -will consider A-line  Anemia: likely chronic  P: -Trend CBC -transfuse for hgb <7  Acute Metabolic Encephalopathy: hx of ALS, hypercapnia, and septic; CT head 6/8 negative P: -supportive care -avoid sedative meds  Hyperkalemia P: -will recheck BMP later   Best practice (right click and "Reselect all SmartList Selections" daily)  Diet:  NPO Pain/Anxiety/Delirium protocol (if indicated): Yes (RASS goal 0) VAP protocol (if indicated): Yes DVT prophylaxis: Subcutaneous Heparin GI prophylaxis: PPI Glucose control:  SSI No Central venous access:  N/A Arterial line:  N/A Foley:  Yes, and it is still needed Mobility:  bed rest  PT consulted: N/A Last date of multidisciplinary goals of care discussion [6/8: Spoke to wife, Raquel Sarna, who is POA and plan is DNR at this time with palliative care consult pending] Code Status:  DNR Disposition: ICU  Labs   CBC: Recent Labs  Lab 02/04/2021 1030  WBC 23.4*  NEUTROABS 21.4*  HGB 8.4*  HCT 31.7*  MCV 80.3  PLT 620    Basic Metabolic Panel: Recent Labs  Lab 02/04/2021 1030  NA 135  K 5.4*  CL 87*  CO2 45*  GLUCOSE 103*  BUN 34*  CREATININE <0.30*  CALCIUM 9.6   GFR: CrCl cannot be calculated (This lab value cannot be used to calculate CrCl because it is not a number: <0.30). Recent Labs  Lab 02/14/2021 1030 02/20/2021 1145  WBC 23.4*  --   LATICACIDVEN 0.9 0.8    Liver Function Tests: Recent Labs  Lab 02/08/2021 1030  AST 22  ALT  20  ALKPHOS 229*  BILITOT 0.1*  PROT 7.6  ALBUMIN 1.6*   No results for input(s): LIPASE, AMYLASE in the last 168 hours. No results for input(s): AMMONIA in the last 168 hours.  ABG    Component Value Date/Time   PHART 7.186 (LL) 02/21/2021 1230   PCO2ART >120.0 (HH) 02/02/2021 1230   PO2ART 219 (H) 03/03/2021 1230   HCO3 38.7 (H) 02/20/2021 1230   TCO2 40 (H) 08/17/2020 0352   ACIDBASEDEF 1.0 02/09/2020 0451   O2SAT 98.3 02/08/2021 1230     Coagulation Profile: Recent Labs  Lab 02/03/2021 1030  INR 1.0    Cardiac Enzymes: No results for input(s): CKTOTAL, CKMB, CKMBINDEX, TROPONINI in the last 168 hours.  HbA1C: Hgb A1c MFr Bld  Date/Time Value Ref Range Status  02/09/2020 02:48 AM 5.3 4.8 - 5.6 % Final    Comment:    (NOTE) Pre diabetes:          5.7%-6.4% Diabetes:              >6.4% Glycemic control for   <7.0% adults with  diabetes     CBG: No results for input(s): GLUCAP in the last 168 hours.  Review of Systems:   Unable to obtain from patient. Trached on ventilator. Obtained information from nurse and chart.  Past Medical History:  He,  has a past medical history of ALS (amyotrophic lateral sclerosis) ([place]).   Surgical History:   Past Surgical History:  Procedure Laterality Date  . GASTROSTOMY TUBE PLACEMENT    . TRACHEOSTOMY       Social History:   reports that he has never smoked. He has never used smokeless tobacco. He reports that he does not drink alcohol and does not use drugs.   Family History:  His family history is not on file.   Allergies No Known Allergies   Home Medications  Prior to Admission medications   Medication Sig Start Date End Date Taking? Authorizing Provider  acetaminophen (TYLENOL) 325 MG tablet Place 650 mg into feeding tube every 6 (six) hours as needed for fever.   Yes [provider]  Amino Acids-Protein Hydrolys (FEEDING SUPPLEMENT, PRO-STAT SUGAR FREE 64,) LIQD Place 30 mLs into feeding tube daily.    Yes [provider]  Artificial Saliva (BIOTENE ORALBALANCE DRY MOUTH) GEL Use as directed 1 application in the mouth or throat 3 (three) times daily.   Yes [provider]  ascorbic acid (VITAMIN C) 500 MG tablet Place 500 mg into feeding tube every evening. Give at 3pm   Yes [provider]  bisacodyl (DULCOLAX) 10 MG suppository Place 10 mg rectally daily as needed for moderate constipation.   Yes [provider]  ceFEPIme 1 g in sodium chloride 0.9 % 100 mL Inject 1 g into the vein every 12 (twelve) hours. 02/06/21 02/15/21 Yes [provider]  chlorhexidine (PERIDEX) 0.12 % solution Use as directed 15 mLs in the mouth or throat See admin instructions. Swab mouth with chlorhexidine twice daily   Yes [provider]  Cholecalciferol (VITAMIN D) 50 MCG (2000 UT) tablet Place 2,000 Units into feeding tube every evening. 3pm   Yes [provider]  cloNIDine (CATAPRES) 0.1 MG tablet Place 0.1 mg into feeding tube every 8 (eight) hours as needed (hypertensive episodes related to ALS).   Yes [provider]  diphenhydrAMINE (BENADRYL) 25 MG tablet 25 mg every 8 (eight) hours as needed for itching.   Yes [provider]  enoxaparin (LOVENOX) 30 MG/0.3ML injection Inject 30 mg into the skin daily.   Yes [provider]  folic acid (FOLVITE) 1 MG tablet Place 1 mg into feeding tube every evening. 3pm   Yes [provider]  gabapentin (NEURONTIN) 300 MG capsule Place 300 mg into feeding tube 3 (three) times daily.   Yes [provider]  glycopyrrolate (ROBINUL) 1 MG tablet Take 1 mg by mouth 3 (three) times daily.   Yes [provider]  ipratropium-albuterol (DUONEB) 0.5-2.5 (3) MG/3ML SOLN Take 3 mLs by nebulization every 4 (four) hours as needed (shortness of breath).   Yes [provider]  Lactobacillus (PROBIOTIC ACIDOPHILUS) CAPS 1.5 mg by Gastrostomy Tube route daily.   Yes  [provider]  linaclotide (LINZESS) 145 MCG CAPS capsule Place 145 mcg into feeding tube daily.   Yes [provider]  LORazepam (ATIVAN) 1 MG tablet Place 1 mg into feeding tube daily at 12 noon.   Yes [provider]  Melatonin 3 MG TABS Give 6 mg by tube at bedtime.    Yes [provider]  midodrine (PROAMATINE) 5 MG tablet Place 5 mg into feeding tube every 8 (eight) hours as needed (systolic blood pressure < or = 90).   Yes [provider]  Multiple Vitamins-Minerals (MULTIVITAMIN WITH MINERALS) tablet Place 1 tablet into feeding tube daily.    Yes [provider]  nutrition supplement, JUVEN, (JUVEN) PACK Place 1 packet into feeding tube 2 (two) times daily.   Yes [provider]  Nutritional Supplements (FEEDING SUPPLEMENT, VITAL 1.5 CAL,) LIQD Place 55 mLs into feeding tube in the morning and at bedtime.   Yes [provider]  ondansetron (ZOFRAN) 4 MG tablet Place 4 mg into feeding tube every 6 (six) hours as needed for nausea or vomiting.   Yes [provider]  oxyCODONE (OXY IR/ROXICODONE) 5 MG immediate release tablet Take 5 mg by mouth every 8 (eight) hours as needed for breakthrough pain. May also take 13m per tube 30 minutes prior to wound care twice daily.   Yes [provider]  Oxycodone HCl 10 MG TABS Place 10 mg into feeding tube in the morning, at noon, and at bedtime.   Yes [provider]  pantoprazole sodium (PROTONIX) 40 mg/20 mL PACK Place 40 mg into feeding tube daily.   Yes [provider]  polyethylene glycol (MIRALAX / GLYCOLAX) 17 g packet Take 17 g by mouth daily. Patient taking differently: Place 17 g into feeding tube daily. 02/10/20  Yes SArnell Asal NP  riluzole (RILUTEK) 50 MG tablet Place 50 mg into feeding tube in the morning and at bedtime.   Yes [provider]  scopolamine (TRANSDERM-SCOP) 1 MG/3DAYS Place 1 patch onto the skin every 3  (three) days.   Yes [provider]  sennosides (SENOKOT) 8.8 MG/5ML syrup Place 5 mLs into feeding tube daily in the afternoon.   Yes [provider]  sertraline (ZOLOFT) 100 MG tablet Place 100 mg into feeding tube daily.   Yes [provider]  shark liver oil-cocoa butter (PREPARATION H) 0.25-88.44 % suppository Place 1 suppository rectally every 6 (six) hours as needed for hemorrhoids.   Yes [provider]  simethicone (MYLICON) 80 MG chewable tablet Place 160 mg into feeding tube every 8 (eight) hours.    Yes [provider]  traZODone (DESYREL) 50 MG tablet Place 50 mg into feeding tube at bedtime.   Yes [provider]  Water For Irrigation, Sterile (FREE WATER) SOLN Place 100 mLs into feeding tube every 6 (six) hours.   Yes [provider]  zinc sulfate 220 (50 Zn) MG capsule Place 220 mg into feeding tube at bedtime.    Yes [provider]  Nutritional Supplements (COMPLEAT PEPTIDE 1.5) LIQD Place 25 mL/hr into feeding tube continuous.    [provider]     Critical care time: 454   JD [facility] 02/16/2021, 2:00 PM  Please see [email or web address] for pager details.  From 7A-7P if no response, please call [phone]. After hours, please call ELink [phone]

## 2021-02-09 NOTE — Progress Notes (Signed)
Ventilator patient transported from ED18 to 2M08 without any complications.

## 2021-02-09 NOTE — ED Triage Notes (Signed)
BIB EMS from Long Term care at [facility] in [place]. Staff noted a low rectal temp this morning. Currently being treated for pneumonia. PICC line left arm noted.

## 2021-02-09 NOTE — ED Notes (Signed)
Nurse report given to Leatrice Jewels ICU nurse. Called wife Irving Burton update given.

## 2021-02-09 NOTE — Procedures (Signed)
Arterial Catheter Insertion Procedure Note  Abby Stines  [id number]  1966-08-20  Date:02/05/2021  Time:5:03 PM    Provider Performing: Lidia Collum    Procedure: Insertion of Arterial Line (40973) with US guidance (53299)   Indication(s) Blood pressure monitoring and/or need for frequent ABGs  Consent Risks of the procedure as well as the alternatives and risks of each were explained to the patient and/or caregiver.  Consent for the procedure was obtained and is signed in the bedside chart  Anesthesia None   Time Out Verified patient identification, verified procedure, site/side was marked, verified correct patient position, special equipment/implants available, medications/allergies/relevant history reviewed, required imaging and test results available.   Sterile Technique Maximal sterile technique including full sterile barrier drape, hand hygiene, sterile gown, sterile gloves, mask, hair covering, sterile ultrasound probe cover (if used).   Procedure Description Area of catheter insertion was cleaned with chlorhexidine and draped in sterile fashion. With real-time ultrasound guidance an arterial catheter was placed into the left radial artery.  Appropriate arterial tracings confirmed on monitor.     Complications/Tolerance None; patient tolerated the procedure well.   EBL Minimal   Specimen(s) None  JD Anselm Lis [facility] 02/20/2021, 5:03 PM  Please see [email or web address] for pager details.  From 7A-7P if no response, please call [phone]. After hours, please call ELink [phone].

## 2021-02-09 NOTE — ED Provider Notes (Signed)
[facility] EMERGENCY DEPARTMENT Provider Note   CSN: [id number] Arrival date & time: 02/14/2021  0941     History Chief Complaint  Patient presents with  . Cold Exposure  . Pneumonia    Stuart Woods is a 55 y.o. male.  HPI     Level 5 caveat patient chronically on ventilator 55 year old male history of ALS, chronic tori failure, on ventilator, presents today from long[facility]term [facility] facility([facility]) with reports that he is hypothermic. Called [facility]- reports cxr and ct last week with infiltrate -ct scattered parenchymal bilateral lung fields most pronounced lul and rll February 20, 2021.  Baseline mental status tracks with eyes,nonverbal, today less responsive without eye tracking.  Temp rectal 88.3.  Patient on cefepime, trach aspirate 6/7 with proteus,  Pseudomonas,  providencia stuartii coronabacterium species.  Pseudomonas multi drug resistant except to trimethopin. Code status. [id number]  Past Medical History:  Diagnosis Date  . ALS (amyotrophic lateral sclerosis) [facility])     Patient Active Problem List   Diagnosis Date Noted  . Healthcare-associated pneumonia   . Sepsis with acute renal failure without septic shock (HCC)   . Tracheostomy dependent (HCC)   . Severe sepsis (HCC)   . Acute respiratory failure (HCC) 08/16/2020  . Small bowel obstruction (HCC) 02/09/2020  . Malnutrition of moderate degree 02/09/2020  . Abdominal pain 02/09/2020  . SBO (small bowel obstruction) (HCC) 02/09/2020  . ALS (amyotrophic lateral sclerosis) (HCC)   . Partial intestinal obstruction (HCC)   . Pneumothorax, right   . Ventilator dependence (HCC)   . Pressure injury of skin 09/23/2019  . Acute encephalopathy 09/22/2019    Past Surgical History:  Procedure Laterality Date  . GASTROSTOMY TUBE PLACEMENT    . TRACHEOSTOMY         History reviewed. No pertinent family history.  Social History   Tobacco Use  . Smoking status: Never Smoker  . Smokeless tobacco:  Never Used  Substance Use Topics  . Alcohol use: Never  . Drug use: Never    Home Medications Prior to Admission medications   Medication Sig Start Date End Date Taking? Authorizing Provider  Amino Acids-Protein Hydrolys (FEEDING SUPPLEMENT, PRO-STAT SUGAR FREE 64,) LIQD Place 30 mLs into feeding tube daily.    [provider]  Artificial Saliva (BIOTENE ORALBALANCE DRY MOUTH) GEL Use as directed 1 application in the mouth or throat 3 (three) times daily.    [provider]  ascorbic acid (VITAMIN C) 500 MG tablet Place 500 mg into feeding tube every evening. 3pm    [provider]  bisacodyl (DULCOLAX) 10 MG suppository Place 10 mg rectally daily as needed for moderate constipation.    [provider]  chlorhexidine (PERIDEX) 0.12 % solution Use as directed 15 mLs in the mouth or throat See admin instructions. Swab mouth with chlorhexidine every shift    [provider]  Cholecalciferol (VITAMIN D) 50 MCG (2000 UT) tablet Place 2,000 Units into feeding tube every evening. 3pm    [provider]  cloNIDine (CATAPRES) 0.1 MG tablet Place 0.1 mg into feeding tube every 8 (eight) hours as needed (hypertensive episodes related to ALS).    [provider]  diphenhydrAMINE (BENADRYL) 25 MG tablet 25 mg every 8 (eight) hours as needed for itching.    [provider]  enoxaparin (LOVENOX) 30 MG/0.3ML injection Inject 30 mg into the skin daily.    [provider]  folic acid (FOLVITE) 1 MG tablet Place 2 mg into feeding tube every  evening. 3pm    [provider]  gabapentin (NEURONTIN) 300 MG capsule Place 300 mg into feeding tube every 8 (eight) hours.     [provider]  glycopyrrolate (ROBINUL) 1 MG tablet Take 1 mg by mouth every 8 (eight) hours.    [provider]  ipratropium-albuterol (DUONEB) 0.5-2.5 (3) MG/3ML SOLN Take 3 mLs by nebulization every 4 (four) hours as needed (shortness of  breath/wheezing).  Patient not taking: No sig reported    [provider]  levofloxacin (LEVAQUIN) 500 MG tablet Take 1 tablet (500 mg total) by mouth daily. 10/19/20   Placido Sou, PA-C  linaclotide (LINZESS) 145 MCG CAPS capsule Place 145 mcg into feeding tube daily before breakfast.    [provider]  LORazepam (ATIVAN) 1 MG tablet Place 1 mg into feeding tube daily at 12 noon.    [provider]  Melatonin 3 MG TABS Give 6 mg by tube at bedtime.     [provider]  midodrine (PROAMATINE) 5 MG tablet Place 5 mg into feeding tube every 8 (eight) hours as needed (SBP < or = 90).    [provider]  Multiple Vitamins-Minerals (MULTIVITAMIN WITH MINERALS) tablet Place 1 tablet into feeding tube daily.     [provider]  nutrition supplement, JUVEN, (JUVEN) PACK Place 1 packet into feeding tube 2 (two) times daily.    [provider]  Nutritional Supplements (COMPLEAT PEPTIDE 1.5) LIQD Place 25 mL/hr into feeding tube continuous.    [provider]  ondansetron (ZOFRAN) 4 MG tablet Place 4 mg into feeding tube every 6 (six) hours as needed for nausea or vomiting.    [provider]  oxyCODONE (OXY IR/ROXICODONE) 5 MG immediate release tablet Place 5 mg into feeding tube See admin instructions. Take one tablet (5 mg) per tube every 6 hours, may also take one tablet (5 mg) per tube 30 minutes prior to wound care    [provider]  pantoprazole sodium (PROTONIX) 40 mg/20 mL PACK Place 40 mg into feeding tube daily.    [provider]  polyethylene glycol (MIRALAX / GLYCOLAX) 17 g packet Take 17 g by mouth daily. Patient taking differently: Place 17 g into feeding tube daily. 02/10/20   Norton Blizzard, NP  Probiotic Product (PROBIOTIC ADVANCED PO) Place 1 capsule into feeding tube every 12 (twelve) hours. Ultimate Flora 30 billion    [provider]  riluzole (RILUTEK) 50 MG tablet Place 50  mg into feeding tube every 12 (twelve) hours.    [provider]  scopolamine (TRANSDERM-SCOP) 1 MG/3DAYS Place 1 patch onto the skin every 3 (three) days.    [provider]  sennosides (SENOKOT) 8.8 MG/5ML syrup Place 5 mLs into feeding tube at bedtime.    [provider]  sertraline (ZOLOFT) 100 MG tablet Place 100 mg into feeding tube daily.    [provider]  shark liver oil-cocoa butter (PREPARATION H) 0.25-88.44 % suppository Place 1 suppository rectally every 6 (six) hours as needed for hemorrhoids.    [provider]  simethicone (MYLICON) 80 MG chewable tablet Place 160 mg into feeding tube every 8 (eight) hours.     [provider]  sodium chloride flush 0.9 % SOLN injection Inject 10 mLs into the vein as needed (before and after medication administration/ flush each unused lumen).    [provider]  traZODone (DESYREL) 50 MG tablet Place 50 mg into feeding tube at bedtime.  [provider]  Water For Irrigation, Sterile (FREE WATER) SOLN Place 100 mLs into feeding tube every 6 (six) hours.    [provider]  zinc sulfate 220 (50 Zn) MG capsule Place 220 mg into feeding tube at bedtime.     [provider]    Allergies    Patient has no known allergies.  Review of Systems   Review of Systems  Unable to perform ROS: Mental status change  All other systems reviewed and are negative.   Physical Exam Updated Vital Signs There were no vitals taken for this visit.  Physical Exam Vitals and nursing note reviewed.  Constitutional:      General: He is in acute distress.     Appearance: He is not ill-appearing.  HENT:     Head: Normocephalic.     Right Ear: External ear normal.     Left Ear: External ear normal.     Nose: Nose normal.     Mouth/Throat:     Pharynx: Oropharynx is clear.  Eyes:     Extraocular Movements: Extraocular movements intact.     Comments: Left pupil greater  than right  Cardiovascular:     Rate and Rhythm: Normal rate and regular rhythm.     Pulses: Normal pulses.  Pulmonary:     Comments: Trach in place, no surrounding erythema Patient on ventilator Abdominal:     General: Bowel sounds are normal.     Palpations: Abdomen is soft.  Musculoskeletal:     Cervical back: Normal range of motion.     Comments: Dressings in place bilateral heels  Skin:    Capillary Refill: Capillary refill takes less than 2 seconds.     Coloration: Skin is pale.     ED Results / Procedures / Treatments   Labs (all labs ordered are listed, but only abnormal results are displayed) Labs Reviewed  CULTURE, BLOOD (ROUTINE X 2)  CULTURE, BLOOD (ROUTINE X 2)  CULTURE, BLOOD (SINGLE)  URINE CULTURE  RESP PANEL BY RT-PCR (FLU A&B, COVID) ARPGX2  LACTIC ACID, PLASMA  LACTIC ACID, PLASMA  COMPREHENSIVE METABOLIC PANEL  CBC WITH DIFFERENTIAL/PLATELET  PROTIME-INR  APTT  URINALYSIS, ROUTINE W REFLEX MICROSCOPIC    EKG EKG Interpretation  Date/Time:  Wednesday February 09 2021 09:42:27 EDT Ventricular Rate:  67 PR Interval:  164 QRS Duration: 98 QT Interval:  412 QTC Calculation: 435 R Axis:   83 Text Interpretation: Sinus rhythm Right atrial enlargement diffuse st elevation inferior and lateral unchanged from prior Confirmed by Margarita Grizzleay, Dwayne Begay [phone]([id number]) on 02/08/2021 9:59:14 AM   Radiology DG Chest Port 1 View  Result Date: 02/11/2021 CLINICAL DATA:  Questionable sepsis. EXAM: PORTABLE CHEST 1 VIEW COMPARISON:  October 19, 2020 FINDINGS: Tracheostomy tube in stable position. Cardiomediastinal silhouette is normal. Mediastinal contours appear intact. Diffuse bilateral patchy areas of airspace consolidation. No pleural effusion or pneumothorax. Osseous structures are without acute abnormality. Soft tissues are grossly normal. IMPRESSION: Diffuse bilateral patchy areas of airspace consolidation. This may represent multifocal pneumonia or asymmetric pulmonary edema.  Electronically Signed   By: Ted Mcalpineobrinka  Dimitrova M.D.   On: October 09, 2020 10:40    Procedures .Critical Care Performed by: Margarita Grizzleay, Chanay Nugent, MD Authorized by: Margarita Grizzleay, Malgorzata Albert, MD   Critical care provider statement:    Critical care time (minutes):  75   Critical care was necessary to treat or prevent imminent or life-threatening deterioration of the following conditions:  Shock, sepsis, respiratory failure and metabolic crisis   Critical care was  time spent personally by me on the following activities:  Discussions with consultants, evaluation of patient's response to treatment, examination of patient, ordering and performing treatments and interventions, ordering and review of laboratory studies, ordering and review of radiographic studies, pulse oximetry, re-evaluation of patient's condition, obtaining history from patient or surrogate and review of old charts     Medications Ordered in ED Medications - No data to display  ED Course  I have reviewed the triage vital signs and the nursing notes.  Pertinent labs & imaging results that were available during my care of the patient were reviewed by me and considered in my medical decision making (see chart for details).    MDM Rules/Calculators/A&P                          Mr. Sonda Rumble is a 55 year old male history of ALS, chronic vent dependence, at [facility], who was sent here today due to altered mental status and hypothermia.  They also have respiratory cultures from June 7 that show Proteus mirabilis, Pseudomonas, Providencia stuartii I, multidrug-resistant, and corynebacterium skin growth.  Discussed with nurse practitioner at [facility].  She states patient only has ability to track with his eyes and have some communication via eye tracking.  She also reports that he has been his own power of attorney and is a full code. Evaluation here reveals chest x-Waunita Sandstrom with multifocal infiltrate.  Patient has been hypothermic and persistently  hypotensive despite 3 L of lactated Ringer's.  He is on a Lawyer. Pharmacy was consulted and patient treated with Cipro Baxa due to multidrug-resistant. ABG obtained shows pH of 7.15 with PCO2 greater than 110 and PO2 of 348.  Respiratory therapy has increased patient's rate from 18-30. Infection with likely multifocal pneumonia, leukocytosis 23,000, and persistent hypotension.  Patient was placed on code sepsis protocol. Critical care consulted and they will see for evaluation and admission. Anemia with hemoglobin of 8.8 which appears to be close to patient's ongoing baseline Final Clinical Impression(s) / ED Diagnoses Final diagnoses:  Hypothermia, initial encounter  Altered mental status, unspecified altered mental status type  Acidosis  HCAP (healthcare-associated pneumonia)  Chronic respiratory failure requiring continuous mechanical ventilation through tracheostomy [facility])    Rx / DC Orders ED Discharge Orders    None       Margarita Grizzle, MD March 01, 2021 1308

## 2021-02-09 NOTE — Sepsis Progress Note (Signed)
elink monitoring code sepsis.  

## 2021-02-09 NOTE — Consult Note (Signed)
[facility]    Date of Admission:  03/06/21   Total days of antibiotics 1               Reason for Consult: Multidrug-resistant Pseudomonas pneumonia    Referring Provider: Max Fickle, MD Primary Care Provider: NA  Assessment: Patient has history of ALS on chronic trach and PEG, was transferred from [facility] for altered mental status, hypothermia, found to have pneumonia.  Chest x-ray showed diffuse bilateral consolidations.  In the ED, code sepsis was called due to hypotension requiring vasopressor, hypothermia and severe respiratory acidosis.  Tracheal aspirate on 6/7 showed Proteus, Providencia, Corynebacterium, and multidrug-resistant Pseudomonas.  Patient was in the hospital in December also for multidrug-resistant Pseudomonas.  Poor prognosis overall.  Continue ceftolozane-tazobactam for MDR pseudomonal pneumonia.  Pending blood culture and urine culture.  Plan: 1. Continue ceftolozane-tazobactam 2. Pending blood culture and urine culture  Active Problems:   Septic shock (HCC)   Scheduled Meds: . docusate  100 mg Per Tube BID  . heparin  5,000 Units Subcutaneous Q8H  . pantoprazole sodium  40 mg Per Tube Daily  . polyethylene glycol  17 g Per Tube Daily   Continuous Infusions: . ceftolozane-tazobactam (ZERBAXA) IVPB 3 g (03/06/21 1408)  . lactated ringers     And  . lactated ringers    . lactated ringers 150 mL/hr at March 06, 2021 1245  . lactated ringers    . norepinephrine (LEVOPHED) Adult infusion 5 mcg/min (03/06/21 1405)   PRN Meds:.docusate sodium, fentaNYL (SUBLIMAZE) injection, fentaNYL (SUBLIMAZE) injection, ipratropium-albuterol, polyethylene glycol  HPI: Stuart Woods is a 55 y.o. male with history of ALS diagnosed 2 years ago, on chronic ventilator and PEG tube, who was transferred from [facility] for altered mental status, hypothermia found to have pneumonia on chest x-ray.   Patient is seen at bedside.  He is not  responsive to verbal or physical stimulation.  Review of Systems: Unable to obtain given altered mental status ROS  Past Medical History:  Diagnosis Date  . ALS (amyotrophic lateral sclerosis) (HCC)     Social History   Tobacco Use  . Smoking status: Never Smoker  . Smokeless tobacco: Never Used  Substance Use Topics  . Alcohol use: Never  . Drug use: Never    History reviewed. No pertinent family history. No Known Allergies  OBJECTIVE: Blood pressure 91/60, pulse 85, temperature (!) 92.9 F (33.8 C), resp. rate (!) 30, height 5\' 11"  (1.803 m), weight 56 kg, SpO2 91 %.  Physical Exam Constitutional:      Appearance: He is ill-appearing.     Comments: Nonresponsive to verbal or physical stimulation.  Does not appear in acute distress.  HENT:     Head: Normocephalic.  Cardiovascular:     Rate and Rhythm: Normal rate and regular rhythm.     Heart sounds: Normal heart sounds. No murmur heard.     Comments: +2 edema bilateral lower extremity Trace edema bilateral lower extremity Pulmonary:     Comments: Trach in place Abdominal:     General: There is no distension.     Comments: PEG tube in place  Neurological:     Comments: Obtunded     Lab Results Lab Results  Component Value Date   WBC 23.4 (H) 03-06-21   HGB 8.4 (L) 03-06-21   HCT 31.7 (L) Mar 06, 2021   MCV 80.3 06-Mar-2021   PLT 348 2021/03/06    Lab Results  Component  Value Date   CREATININE <0.30 (L) 02/16/21   BUN 34 (H) 02-16-2021   NA 135 02-16-2021   K 5.4 (H) Feb 16, 2021   CL 87 (L) February 16, 2021   CO2 45 (H) 2021/02/16    Lab Results  Component Value Date   ALT 20 16-Feb-2021   AST 22 02/16/2021   ALKPHOS 229 (H) February 16, 2021   BILITOT 0.1 (L) 02-16-21     Microbiology: Recent Results (from the past 240 hour(s))  Resp Panel by RT-PCR (Flu A&B, Covid) Nasopharyngeal Swab     Status: None   Collection Time: 2021-02-16 11:24 AM   Specimen: Nasopharyngeal Swab; Nasopharyngeal(NP) swabs  in vial transport medium  Result Value Ref Range Status   SARS Coronavirus 2 by RT PCR NEGATIVE NEGATIVE Final    Comment: (NOTE) SARS-CoV-2 target nucleic acids are NOT DETECTED.  The SARS-CoV-2 RNA is generally detectable in upper respiratory specimens during the acute phase of infection. The lowest concentration of SARS-CoV-2 viral copies this assay can detect is 138 copies/mL. A negative result does not preclude SARS-Cov-2 infection and should not be used as the sole basis for treatment or other patient management decisions. A negative result may occur with  improper specimen collection/handling, submission of specimen other than nasopharyngeal swab, presence of viral mutation(s) within the areas targeted by this assay, and inadequate number of viral copies(<138 copies/mL). A negative result must be combined with clinical observations, patient history, and epidemiological information. The expected result is Negative.  Fact Sheet for Patients:  BloggerCourse.com  Fact Sheet for Healthcare Providers:  SeriousBroker.it  This test is no t yet approved or cleared by the [place] FDA and  has been authorized for detection and/or diagnosis of SARS-CoV-2 by FDA under an Emergency Use Authorization (EUA). This EUA will remain  in effect (meaning this test can be used) for the duration of the COVID-19 declaration under Section 564(b)(1) of the Act, 21 U.S.C.section 360bbb-3(b)(1), unless the authorization is terminated  or revoked sooner.       Influenza A by PCR NEGATIVE NEGATIVE Final   Influenza B by PCR NEGATIVE NEGATIVE Final    Comment: (NOTE) The Xpert Xpress SARS-CoV-2/FLU/RSV plus assay is intended as an aid in the diagnosis of influenza from Nasopharyngeal swab specimens and should not be used as a sole basis for treatment. Nasal washings and aspirates are unacceptable for Xpert Xpress  SARS-CoV-2/FLU/RSV testing.  Fact Sheet for Patients: BloggerCourse.com  Fact Sheet for Healthcare Providers: SeriousBroker.it  This test is not yet approved or cleared by the [place] FDA and has been authorized for detection and/or diagnosis of SARS-CoV-2 by FDA under an Emergency Use Authorization (EUA). This EUA will remain in effect (meaning this test can be used) for the duration of the COVID-19 declaration under Section 564(b)(1) of the Act, 21 U.S.C. section 360bbb-3(b)(1), unless the authorization is terminated or revoked.  Performed at [facility], [address] [place], [place] [address]     Doran Stabler, DO [facility] [phone] pager   [phone] cell 02-16-21, 2:38 PM

## 2021-02-09 NOTE — Progress Notes (Signed)
PCCM INTERVAL PROGRESS NOTE   Long discussion with the patient's wife Irving Burton. She is aware of the severity of his condition and that it is unlikely he will survive the night. She and their son are en route to visit with him. She has given me the OK to start low dose morphine pending her arrival. No escalation of care at this time.       Joneen Roach, AGACNP-BC [facility]  See Amion for personal pager PCCM on call pager [phone] until 7pm. Please call Elink 7p-7a. [phone]  02/06/2021 7:34 PM

## 2021-02-09 NOTE — Progress Notes (Signed)
Attempted ABG x's 2 unsuccessful. Ayoob Clemmons RRT RCP.

## 2021-02-09 NOTE — Progress Notes (Signed)
Worsening shock, acidosis.  He is on maximum ventilator settings and very high doses of vasopressors.  He has very severe lung injury leading to very poor lung compliance.  We have very few options here.  Have attempted to contact his wife to let her know we have little more to offer.  However she did not answer. Will continue supportive care, check a CXR, and update wife as she is available.  DNR status.

## 2021-02-10 ENCOUNTER — Inpatient Hospital Stay (HOSPITAL_COMMUNITY): Payer: BC Managed Care – PPO

## 2021-02-10 DIAGNOSIS — Z7189 Other specified counseling: Secondary | ICD-10-CM

## 2021-02-10 DIAGNOSIS — J9622 Acute and chronic respiratory failure with hypercapnia: Secondary | ICD-10-CM

## 2021-02-10 DIAGNOSIS — J9621 Acute and chronic respiratory failure with hypoxia: Secondary | ICD-10-CM

## 2021-02-10 DIAGNOSIS — G1221 Amyotrophic lateral sclerosis: Secondary | ICD-10-CM

## 2021-02-10 LAB — BASIC METABOLIC PANEL
Anion gap: 4 — ABNORMAL LOW (ref 5–15)
BUN: 38 mg/dL — ABNORMAL HIGH (ref 6–20)
CO2: 39 mmol/L — ABNORMAL HIGH (ref 22–32)
Calcium: 8.6 mg/dL — ABNORMAL LOW (ref 8.9–10.3)
Chloride: 86 mmol/L — ABNORMAL LOW (ref 98–111)
Creatinine, Ser: <0.3 mg/dL — ABNORMAL LOW (ref 0.61–1.24)
Glucose, Bld: 113 mg/dL — ABNORMAL HIGH (ref 70–99)
Potassium: 5.6 mmol/L — ABNORMAL HIGH (ref 3.5–5.1)
Sodium: 129 mmol/L — ABNORMAL LOW (ref 135–145)

## 2021-02-10 LAB — CBC
HCT: 29.1 % — ABNORMAL LOW (ref 39.0–52.0)
Hemoglobin: 7.4 g/dL — ABNORMAL LOW (ref 13.0–17.0)
MCH: 21.8 pg — ABNORMAL LOW (ref 26.0–34.0)
MCHC: 25.4 g/dL — ABNORMAL LOW (ref 30.0–36.0)
MCV: 85.6 fL (ref 80.0–100.0)
Platelets: 407 10*3/uL — ABNORMAL HIGH (ref 150–400)
RBC: 3.4 MIL/uL — ABNORMAL LOW (ref 4.22–5.81)
RDW: 18.9 % — ABNORMAL HIGH (ref 11.5–15.5)
WBC: 28.1 10*3/uL — ABNORMAL HIGH (ref 4.0–10.5)
nRBC: 0.6 % — ABNORMAL HIGH (ref 0.0–0.2)

## 2021-02-10 LAB — MRSA PCR SCREENING: MRSA by PCR: NEGATIVE

## 2021-02-10 LAB — MAGNESIUM: Magnesium: 1.8 mg/dL (ref 1.7–2.4)

## 2021-02-10 MED ORDER — NOREPINEPHRINE 16 MG/250ML-% IV SOLN
0.0000 ug/min | INTRAVENOUS | Status: DC
  Administered 2021-02-10: 40 ug/min via INTRAVENOUS
  Filled 2021-02-10 (×2): qty 250

## 2021-02-10 MED ORDER — CALCIUM GLUCONATE-NACL 1-0.675 GM/50ML-% IV SOLN
1.0000 g | Freq: Once | INTRAVENOUS | Status: AC
  Administered 2021-02-10: 1000 mg via INTRAVENOUS
  Filled 2021-02-10: qty 50

## 2021-02-10 MED ORDER — LORAZEPAM 2 MG/ML IJ SOLN: 0.5000 mg | Freq: Three times a day (TID) | INTRAMUSCULAR | Status: DC

## 2021-02-10 MED ORDER — PROPOFOL 1000 MG/100ML IV EMUL: 5.0000 ug/kg/min | INTRAVENOUS | Status: DC

## 2021-02-10 MED ORDER — PROPOFOL 500 MG/50ML IV EMUL
INTRAVENOUS | Status: AC
  Administered 2021-02-10: 10 ug/kg/min via INTRAVENOUS
  Filled 2021-02-10: qty 50

## 2021-02-11 LAB — URINE CULTURE: Culture: 80000 — AB

## 2021-02-14 LAB — CULTURE, BLOOD (SINGLE)
Culture: NO GROWTH
Special Requests: ADEQUATE

## 2021-02-14 LAB — CULTURE, BLOOD (ROUTINE X 2)
Culture: NO GROWTH
Culture: NO GROWTH
Special Requests: ADEQUATE

## 2021-03-04 NOTE — Death Summary Note (Signed)
DEATH SUMMARY   Patient Details  Name: Stuart Woods MRN: [id number] DOB: 1966-04-13  Admission/Discharge Information   Admit Date:  18-Feb-2021  Date of Death: Date of Death: 19-Feb-2021  Time of Death: Time of Death: 1440-12-27  Length of Stay: 1  Referring Physician: Patient, No Pcp Per (Inactive)   Reason(s) for Hospitalization  pneumonia  Diagnoses  Preliminary cause of death:  Septic shock due to pneumonia from proteus mirabili, pseudomonas, providencia stuartii and corynebacterium Secondary Diagnoses (including complications and co-morbidities):  Active Problems:   Septic shock (HCC) ALS, advanced Severe muscular deconditioning AKI Acute metabolic encephalopathy Acute respiratory failure with hypoxemia DNR status Comfort measures  Brief Hospital Course (including significant findings, care, treatment, and services provided and events leading to death)  Stuart Woods is a 55 y.o. year old male who has advanced ALS and is ventilator dependent at baseline who presented to [facility] in septic shock from multi-drug resistant gram negative pneumonia (see cause of death above for details).  He was encephalopathic compared to baseline on presentation, had shock and metabolic acidosis refractory to antibiotics and vasopressors support.  He developed oliguric renal failure.  His prognosis was poor.  We discussed his condition with his wife who felt that moving to full comfort measures was appropriate.  After palliative care consultation he was compassionately extubated and died with wife at bedside.    Pertinent Labs and Studies  Significant Diagnostic Studies CT Head Wo Contrast  Result Date: 02-18-2021 CLINICAL DATA:  Mental status changes EXAM: CT HEAD WITHOUT CONTRAST TECHNIQUE: Contiguous axial images were obtained from the base of the skull through the vertex without intravenous contrast. COMPARISON:  09/22/2019 FINDINGS: Brain: Diffuse cerebral atrophy. No acute intracranial  abnormality. Specifically, no hemorrhage, hydrocephalus, mass lesion, acute infarction, or significant intracranial injury. Vascular: No hyperdense vessel or unexpected calcification. Skull: No acute calvarial abnormality. Sinuses/Orbits: No acute findings Other: None IMPRESSION: No acute intracranial abnormality. Electronically Signed   By: Charlett Nose M.D.   On: 02/18/21 12:26   DG Chest Port 1 View  Result Date: 19-Feb-2021 CLINICAL DATA:  Acute respiratory failure.  Hypoxia. EXAM: PORTABLE CHEST 1 VIEW COMPARISON:  Feb 18, 2021. FINDINGS: Interim removal of right IJ sheath. Interim placement right PICC line. PICC line tip at cavoatrial junction. Tracheostomy tube in stable position. Heart size stable. Diffuse severe bilateral nodular pulmonary infiltrates are again noted. Small bilateral pleural effusions. No pneumothorax. IMPRESSION: 1. Interim removal of right IJ sheath. Interim placement right PICC line, its tip is at the cavoatrial junction. Tracheostomy tube in stable position. 2. Diffuse severe bilateral nodular pulmonary infiltrates are again noted. Small bilateral pleural effusions. Electronically Signed   By: Maisie Fus  Register   On: 19-Feb-2021 07:44   DG Chest Port 1 View  Result Date: 02-18-21 CLINICAL DATA:  Worsening oxygen saturation. EXAM: PORTABLE CHEST 1 VIEW COMPARISON:  Earlier same day.  10/19/2020. FINDINGS: Worsening of consolidation and volume loss in the mid and lower lungs bilaterally. Pleural fluid may be accumulating. Tracheostomy remains in place. IMPRESSION: Worsening of consolidation and volume loss in the mid and lower lungs bilaterally. Possible developing effusions. Electronically Signed   By: Paulina Fusi M.D.   On: February 18, 2021 19:18   DG Chest Port 1 View  Result Date: Feb 18, 2021 CLINICAL DATA:  Questionable sepsis. EXAM: PORTABLE CHEST 1 VIEW COMPARISON:  October 19, 2020 FINDINGS: Tracheostomy tube in stable position. Cardiomediastinal silhouette is normal.  Mediastinal contours appear intact. Diffuse bilateral patchy areas of airspace consolidation. No pleural effusion or pneumothorax.  Osseous structures are without acute abnormality. Soft tissues are grossly normal. IMPRESSION: Diffuse bilateral patchy areas of airspace consolidation. This may represent multifocal pneumonia or asymmetric pulmonary edema. Electronically Signed   By: Ted Mcalpine M.D.   On: 03/08/2021 10:40   [place] EKG SITE RITE  Result Date: 08-Mar-2021 If Site Rite image not attached, placement could not be confirmed due to current cardiac rhythm.   Microbiology No results found for this or any previous visit (from the past 240 hour(s)).  Lab Basic Metabolic Panel: No results for input(s): NA, K, CL, CO2, GLUCOSE, BUN, CREATININE, CALCIUM, MG, PHOS in the last 168 hours. Liver Function Tests: No results for input(s): AST, ALT, ALKPHOS, BILITOT, PROT, ALBUMIN in the last 168 hours. No results for input(s): LIPASE, AMYLASE in the last 168 hours. No results for input(s): AMMONIA in the last 168 hours. CBC: No results for input(s): WBC, NEUTROABS, HGB, HCT, MCV, PLT in the last 168 hours. Cardiac Enzymes: No results for input(s): CKTOTAL, CKMB, CKMBINDEX, TROPONINI in the last 168 hours. Sepsis Labs: No results for input(s): PROCALCITON, WBC, LATICACIDVEN in the last 168 hours.  Procedures/Operations  none   Heber [place] 02/21/2021, 1:27 PM

## 2021-03-04 NOTE — Consult Note (Signed)
Palliative Care Consult Note  Mr. Jaxton Casale is a 55 yo man with ALS, ventilator dependent, chronic wounds, multidrug resistant infections and worsening sepsis. Transferred to [facility] from [facility] for ICU level care. Despite all medical interventions he has continued to decline. Palliative care consulted for goals of care and possible terminal care and support.  I met at patient's bedside with his wife Raquel Sarna and a cousin who is a support to [place]. His wife is extremely upset and overwhelmed with grief -family stress is causing her high levels of anxiety and they also have a 19 year old son. She knows that [place] is approaching EOL. She would like to proceed with compassionate extubation and comfort care.  A compassionate extubation was done with wife at bedside. Propofol infusion and morphine infusion were used and titrated at bedside in order achieve complete comfort in an ALS patient without respiratory drive. Propofol was started at 25mg/hr and titrated to 841m/hr for distress. Her required 2 bolus of propofol for comfort. Additionally his morphine infusion was increased to 1054mr for his comfort.  NeiMilta Deiterspired at 2:42PM. I confirmed the absence of heart sounds and provided comfort to his wife at bedside.  Recommend follow-up grief support-I will provide information on hospice grief support.  EliLane HackerO Palliative Medicine  Time: 120 minutes EliLane HackerO Palliative Medicine   Greater than 50%  of this time was spent counseling and coordinating care related to the above assessment and plan.

## 2021-03-04 NOTE — Procedures (Signed)
Extubation Procedure Note  Patient Details:   Name: Stuart Woods DOB: 11-20-1965 MRN: [id number]   Airway Documentation:    Vent end date: 02/14/2021 Vent end time: 1435   Evaluation  O2 sats:  terminal extubation  Complications: Complications of terminal extubation Patient terminal extubation tolerate procedure well. Bilateral Breath Sounds: Clear, Diminished   No  Pt was taken off of the ventilator for terminal extubation.    Merlene Laughter 02/09/2021, 2:46 PM

## 2021-03-04 NOTE — Progress Notes (Signed)
This chaplain responded to PMT-Dr. Lamar Blinks page for EOL spiritual care.    The medical team and family accepted the supplies and opportunity to proceed with making prints of the Pt. hand.  This chaplain is available for F/U spiritual care as needed.

## 2021-03-04 NOTE — Plan of Care (Signed)
  Problem: Coping: Goal: Level of anxiety will decrease Outcome: Progressing   Problem: Elimination: Goal: Will not experience complications related to bowel motility Outcome: Progressing Goal: Will not experience complications related to urinary retention Outcome: Progressing   Problem: Pain Managment: Goal: General experience of comfort will improve Outcome: Progressing   

## 2021-03-04 NOTE — Progress Notes (Addendum)
NAME:  Stuart Woods, MRN:  [id number], DOB:  08/04/1966, LOS: 1 ADMISSION DATE:  2021/02/11, CONSULTATION DATE:  11-Feb-2021 REFERRING MD:  Dr. Rosalia Hammers, CHIEF COMPLAINT: Septic shock, AMS, and hypothermia   History of Present Illness:  Patient is a 55 yo M with pertinent PMH of ALS, chronic vent dependent trach patient from [facility], gastric tube placement admitted to [facility] 11-Feb-2021 with AMS, hypothermia, and septic shock.  Patient is chronic trach patient on chronic mechanical ventilation from [facility]. He has been a patient there since October 2020. He was recently admitted to [facility] in December 2021 for RLL pneumonia with pseudomonas colonization and septic shock. Patient required ID to follow due to antibiotic resistance and required a course of Zerbaxa.   On 02/08/2021, patient had a trach culture that shows Proteus mirabilis, pseudomonas, providencia stuartii, multidrug resistant, corneybacterium. Chest xray revelas multifocal infiltrate. Patient admitted to [facility] on 6/8 for worsening hypotension, AMS, and hypothermia. Labs in ED show: Hgb 8.8, WBC 23, K 5.4, ABG: 7.18, pco2 >120, po2 219, hco3 38.7, o2 sat 98.3. Patient mech vent mode changed to PCV due to high peak pressures on 8cc/kg and RR increased to 30. Pharmacy consulted and placed patient on Zerbaxa. Patients hypotension was fluid responsive after 3 LR boluses. Bair hugger in place and temp is currently 92.   PCCM consulted for vent management, hypothermia, and septic shock.   Pertinent  Medical History   Past Medical History:  Diagnosis Date   ALS (amyotrophic lateral sclerosis) (HCC)      Significant Hospital Events: Including procedures, antibiotic start and stop dates in addition to other pertinent events   6/8: Patient admitted to [facility] with septic shock and hypothermia. Trach patient on mech vent: PCV: increased rr to 30 and 100% 10 peep per resp acidosis and hypoxia.  6/9 remains critically ill on NE 40. Diffuse opacities on CXR    Interim History / Subjective:   PICC placed 6/8 evening Low dose morphine started 6/8 evening with no escalation of care -- family to bedside with poor prognosis. NE to 40 overnight   Wife and cousin at bedside this morning. Long conversation about critical illness, chronic illness, goals of care. Wife hopes to speak with palliative care.  Objective   Blood pressure (!) 85/57, pulse 93, temperature 98.2 F (36.8 C), temperature source Bladder, resp. rate (!) 30, height 5\' 11"  (1.803 m), weight 56 kg, SpO2 96 %.    Vent Mode: PCV FiO2 (%):  [50 %-100 %] 100 % Set Rate:  [18 bmp-30 bmp] 30 bmp PEEP:  [5 cmH20-10 cmH20] 10 cmH20 Plateau Pressure:  [35 cmH20-39 cmH20] 39 cmH20   Intake/Output Summary (Last 24 hours) at 02/14/2021 1049 Last data filed at 03/01/2021 0900 Gross per 24 hour  Intake 5930.3 ml  Output 820 ml  Net 5110.3 ml   Filed Weights   Feb 11, 2021 0955  Weight: 56 kg    Examination:  General: Critically and chronically ill, malnourished appearing M. Reclined in bed. HEENT: Trach secure with tan purulent secretions. Glassy eyes. Pink tacky mm. Temporal muscle wasting  Neuro: Non-responsive, not blinking (baseline blinks with ALS)  CV: rrr cap refill <3. s1s2 PULM: Symmetrical chest expansion, mechanically ventilated. Diminished basilar sounds, crackles.  GI: Soft flat nd.  Extremities: anasarca. No acute joint deformity. No cyanosis  Skin: black ink on R palmar surface of hand. Pale.    Labs/imaging that I havepersonally reviewed  (right click and "Reselect all SmartList Selections" daily)  6/8CXR: diffuse bilalteral patchy airspace consolidation; likely multifocal pneumonia or pulmonary edema CT head: No acute intracranial abnormality BMP CBC ABG  6/9: BMP Na 129 K 5.6 Cl 86 CO2 39 Glu 113 BUN 38 Cr 0.3 Ca 8.6 AG 4 Mag 1.8  CBC WBC 28 hgb 7.4 HCT 29 Plt 406   6/9 CXR diffuse bilateral infiltrates   Resolved Hospital Problem list     Assessment &  Plan:   Goals of Care -55 yo M hx ALS who is chronic trach/vent dependent, with decub ulcers POA and resides at [facility] where he receives ongoing care. Admitted to ICU with septic shock.  -pt continues to have a high pressor requirement (on 40 NE with SBPs 80s) and in review of ABGs, I see worsening acidosis despite vent changes. Poor lung compliance with sever lung injufyr.  -Prognosis is unfortunately poor. He is DNR, and family does want to ensure comfort, but is unsure when to transition to comfort focused care.  -Family would like to speak w PCM prior to this decision, and understands he may die in interim.  -Will continue current level of support, no additional pressors   Acute metabolic encephalopathy -- septic shock hypercarbia  -baseline is able to blink  P: -abx and MV support as below  -ordinarily would limit CNS depressing agents as able, however I anticipate a liekly transition to comfort care 6/9 & family has asked that while continuing current care efforts pt comfort be optimized as well -Morphine gtt (incr from 2 to 4/hr) + [facility] ativan (has helped with nausea in past)   Acute on chronic respiratory failure with hypoxia and hypercapnia  HCAP -- proteus mirabilis, Psa, providencia stuartii, corynebacterium  -- MDRO. CXR 6/9 with diffuse bilateral infiltrates  ALS  P: -cont MV support, unable to wean support 6/9  -Zarbaxa -I anticipate a likely transition to comfort care 6/9 -- if this does not occur, consult ID   Septic shock  -WBC incr from 23 to 28  P: -cont NE. No additional pressors  -Zarbaxa   Anemia P: -trend, no indication for transfusion at present   Hyperkalemia - stable  Hyponatremia  P: -will give 1g CalGlu   Anasarca  -elevate hands on pillows for comfort   Best practice (right click and "Reselect all SmartList Selections" daily)  Diet:  NPO Pain/Anxiety/Delirium protocol (if indicated): Yes (RASS goal 0) VAP protocol (if indicated): Yes DVT  prophylaxis: Subcutaneous Heparin GI prophylaxis: PPI Glucose control:  SSI No Central venous access:  N/A Arterial line:  N/A Foley:  Yes, and it is still needed Mobility:  bed rest  PT consulted: N/A Last date of multidisciplinary goals of care discussion [6/9 spoke with Irving Burton at bedside. She understands poor prognosis but would like to talk w PCM. Consult pending  Code Status:  DNR Disposition: ICU  Labs   CBC: Recent Labs  Lab 02-16-21 1030 02/09/2021 0551  WBC 23.4* 28.1*  NEUTROABS 21.4*  --   HGB 8.4* 7.4*  HCT 31.7* 29.1*  MCV 80.3 85.6  PLT 348 407*    Basic Metabolic Panel: Recent Labs  Lab 02/16/21 1030 02-16-2021 1727 02/11/2021 0551  NA 135 135 129*  K 5.4* 5.6* 5.6*  CL 87* 88* 86*  CO2 45* 42* 39*  GLUCOSE 103* 81 113*  BUN 34* 33* 38*  CREATININE <0.30* <0.30* <0.30*  CALCIUM 9.6 9.2 8.6*  MG  --   --  1.8   GFR: CrCl cannot be calculated (This lab value  cannot be used to calculate CrCl because it is not a number: <0.30). Recent Labs  Lab 02/14/2021 1030 03/03/2021 1145 02/17/2021 0551  WBC 23.4*  --  28.1*  LATICACIDVEN 0.9 0.8  --     Liver Function Tests: Recent Labs  Lab 02/20/2021 1030  AST 22  ALT 20  ALKPHOS 229*  BILITOT 0.1*  PROT 7.6  ALBUMIN 1.6*   No results for input(s): LIPASE, AMYLASE in the last 168 hours. No results for input(s): AMMONIA in the last 168 hours.  ABG    Component Value Date/Time   PHART 7.089 (LL) February 10, 2021 1745   PCO2ART >120.0 (HH) 02/19/2021 1745   PO2ART 172 (H) February 10, 2021 1745   HCO3 40.6 (H) 02/18/2021 1745   TCO2 40 (H) 08/17/2020 0352   ACIDBASEDEF 1.0 02/09/2020 0451   O2SAT 97.7 Feb 10, 2021 1745     Coagulation Profile: Recent Labs  Lab 02/06/2021 1030  INR 1.0    Cardiac Enzymes: No results for input(s): CKTOTAL, CKMB, CKMBINDEX, TROPONINI in the last 168 hours.  HbA1C: Hgb A1c MFr Bld  Date/Time Value Ref Range Status  02/09/2020 02:48 AM 5.3 4.8 - 5.6 % Final    Comment:     (NOTE) Pre diabetes:          5.7%-6.4% Diabetes:              >6.4% Glycemic control for   <7.0% adults with diabetes     CBG: Recent Labs  Lab 02/13/2021 1912  GLUCAP 110*   CRITICAL CARE Performed by: Lanier Clam   Total critical care time: 40 minutes  Critical care time was exclusive of separately billable procedures and treating other patients. Critical care was necessary to treat or prevent imminent or life-threatening deterioration.  Critical care was time spent personally by me on the following activities: development of treatment plan with patient and/or surrogate as well as nursing, discussions with consultants, evaluation of patient's response to treatment, examination of patient, obtaining history from patient or surrogate, ordering and performing treatments and interventions, ordering and review of laboratory studies, ordering and review of radiographic studies, pulse oximetry and re-evaluation of patient's condition.'  Tessie Fass MSN, AGACNP-BC [facility] Pulmonary/Critical Care Medicine Amion for pager details  02/06/2021, 10:49 AM

## 2021-03-04 DEATH — deceased

## 2021-05-06 IMAGING — DX DG CHEST 1V PORT
1 series · 2 of 2 positions shown · non-contrast
Comparison: August 16, 2020

CLINICAL DATA: Evaluate for pneumonia.

EXAM:
PORTABLE CHEST 1 VIEW

[Series 1: chest ap · 0.14mm/px · 2 of 2 slices shown]
[im 1/2]
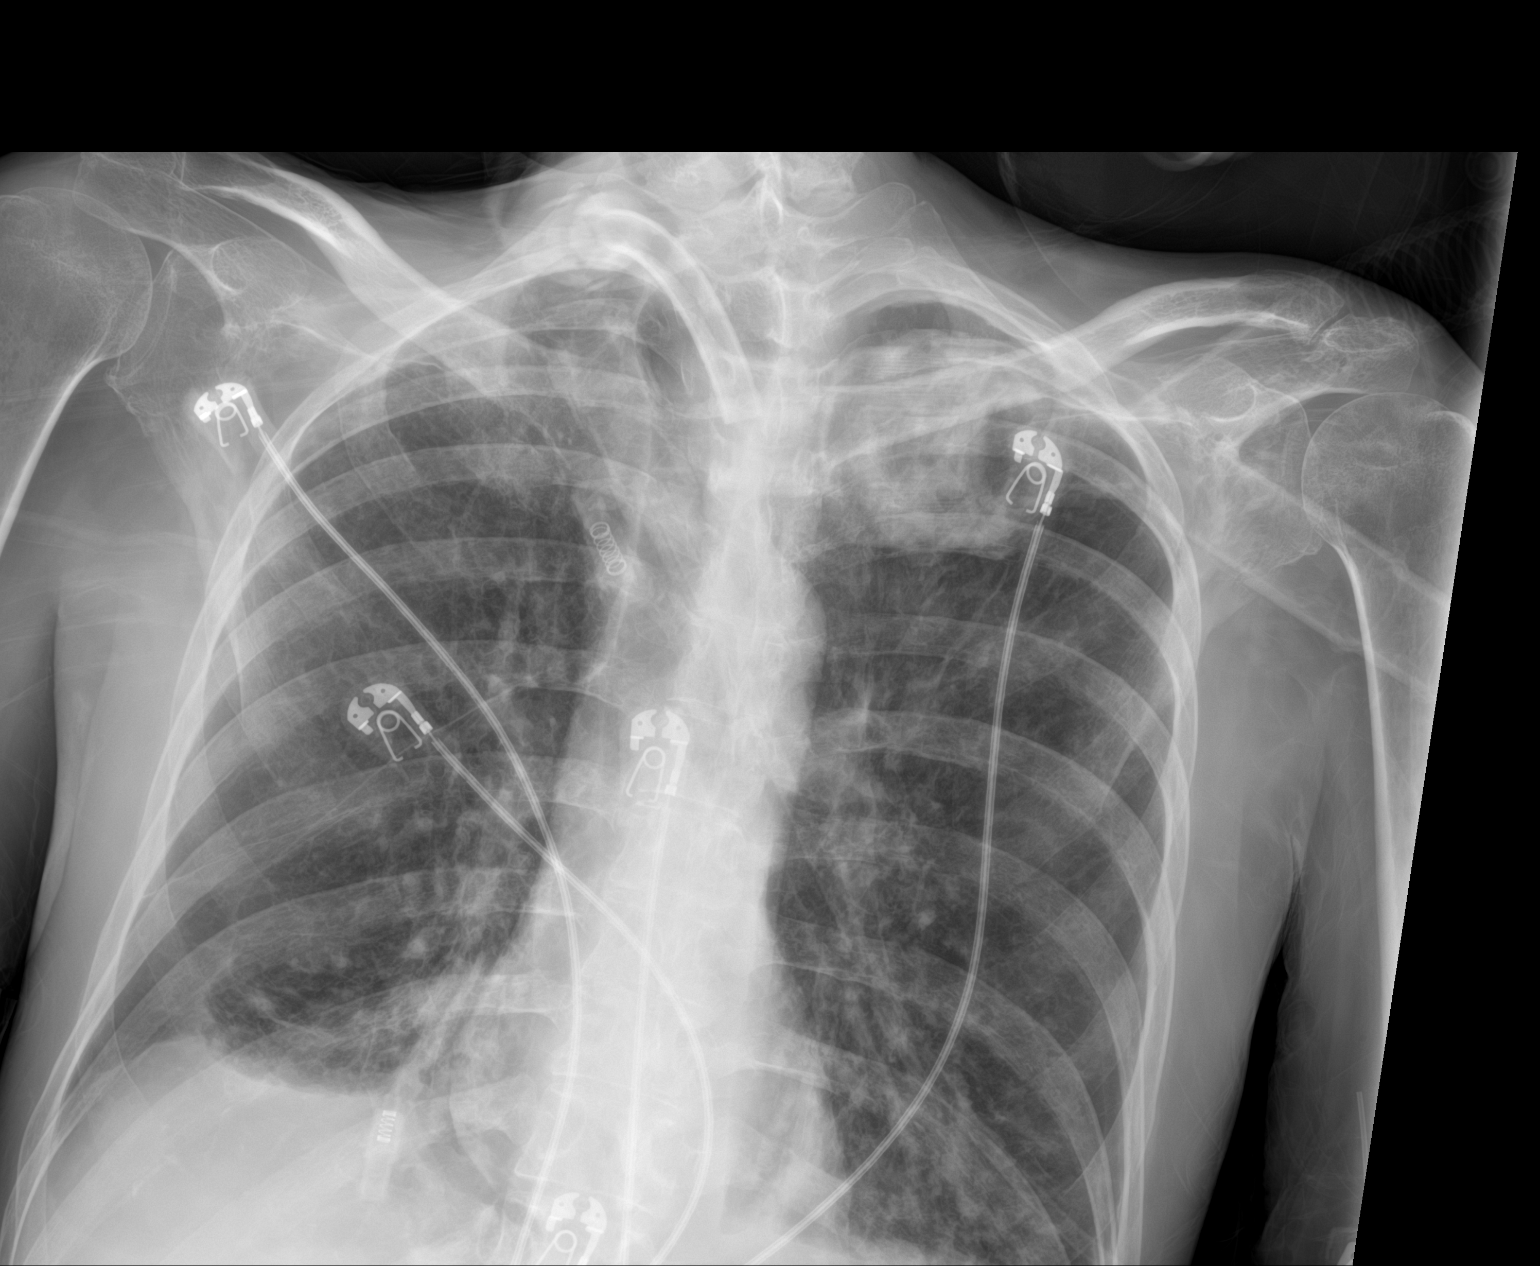
[im 2/2]
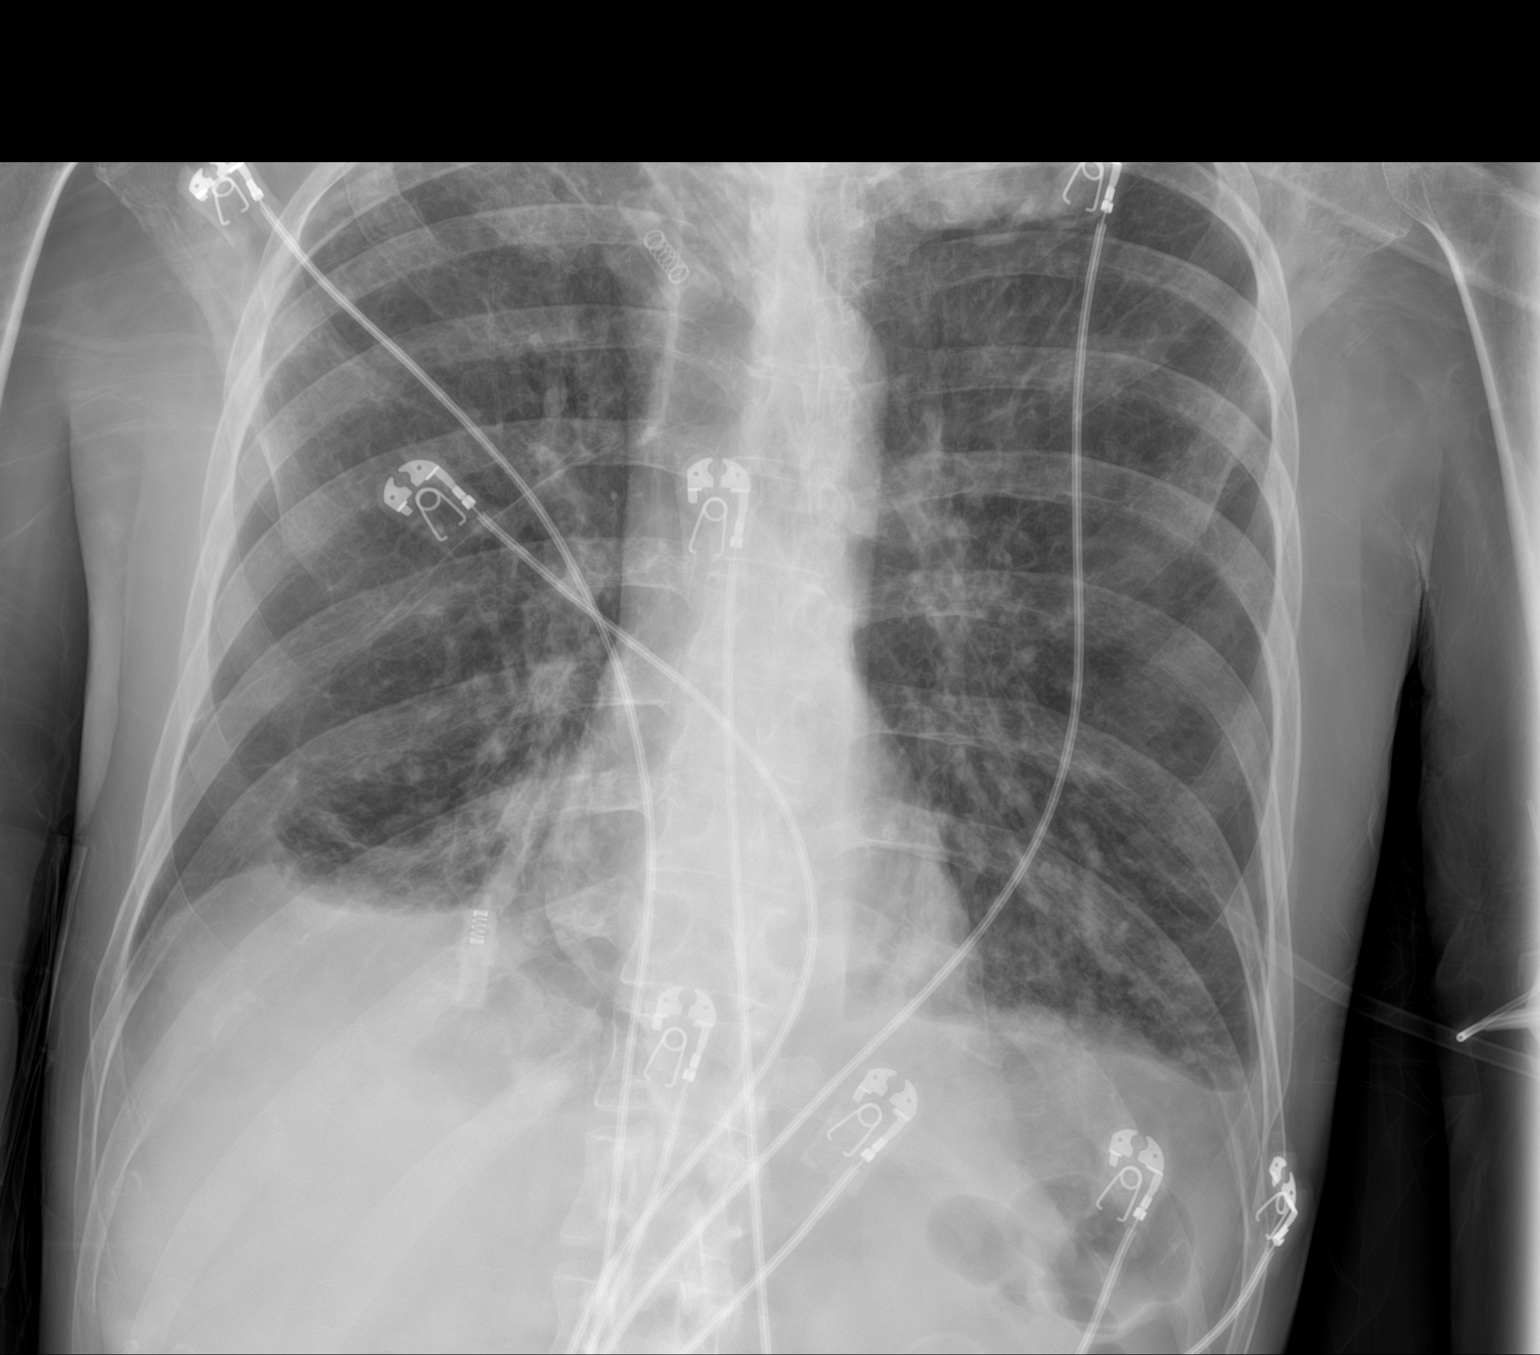

[2 of 2 positions shown; findings below may reference images not displayed]

FINDINGS: A stable tracheostomy tube is noted. The lungs are mildly
hyperinflated. Mild, diffuse chronic appearing increased lung
markings are seen with mild, stable bibasilar linear scarring and/or
atelectasis. There is mild blunting of the left costophrenic angle.
The heart size and mediastinal contours are within normal limits.
The visualized skeletal structures are unremarkable.
IMPRESSION: Mild, diffuse chronic appearing increased lung markings with mild,
stable bibasilar linear scarring and/or atelectasis.

## 2021-08-28 IMAGING — DX DG CHEST 1V PORT
1 series · 1 of 1 positions shown · non-contrast
Comparison: 02/09/2021.

CLINICAL DATA: Acute respiratory failure.  Hypoxia.

EXAM:
PORTABLE CHEST 1 VIEW

[chest ap]
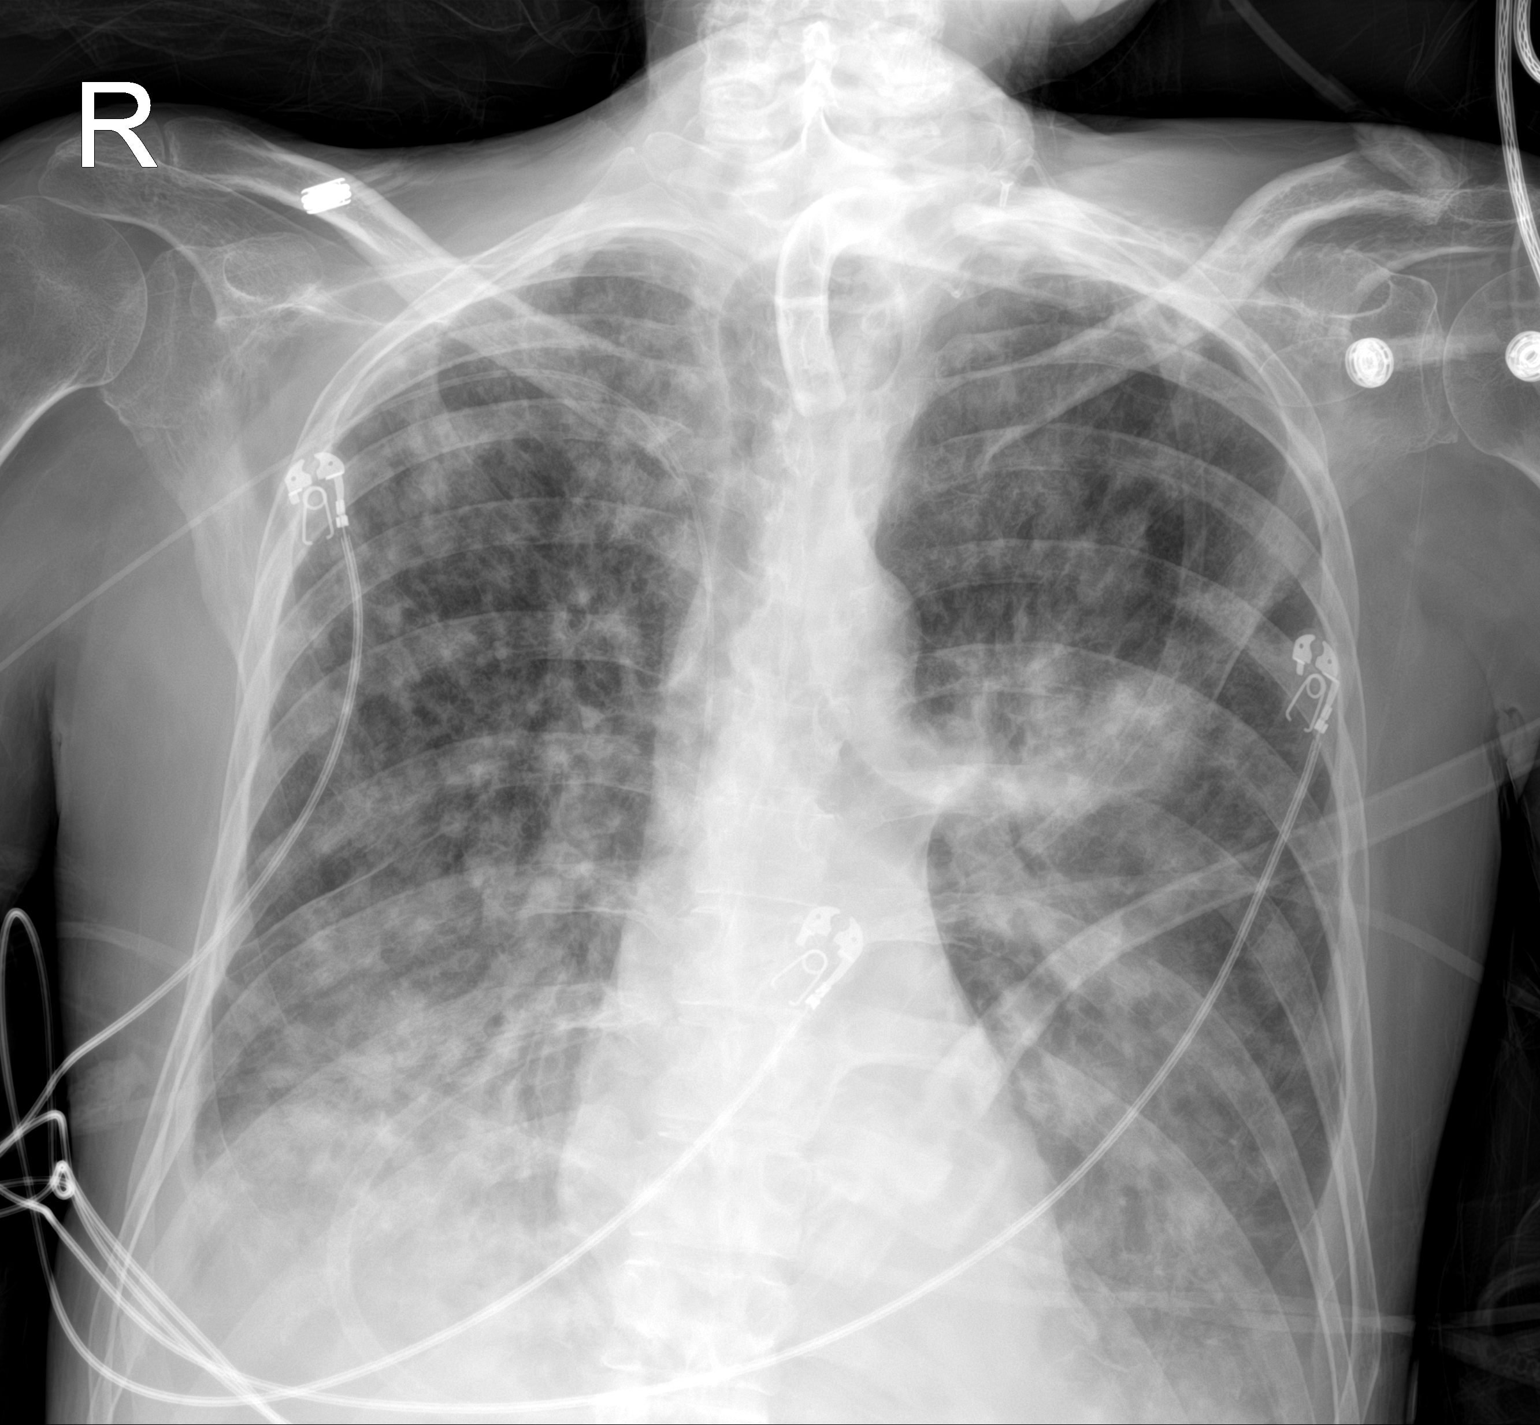

[1 of 1 positions shown; findings below may reference images not displayed]

FINDINGS: Interim removal of right IJ sheath. Interim placement right PICC
line. PICC line tip at cavoatrial junction. Tracheostomy tube in
stable position. Heart size stable. Diffuse severe bilateral nodular
pulmonary infiltrates are again noted. Small bilateral pleural
effusions. No pneumothorax.
IMPRESSION: 1. Interim removal of right IJ sheath. Interim placement right PICC
line, its tip is at the cavoatrial junction. Tracheostomy tube in
stable position.

2. Diffuse severe bilateral nodular pulmonary infiltrates are again
noted. Small bilateral pleural effusions.
